# Patient Record
Sex: Female | Born: 1995 | Race: White | Hispanic: No | Marital: Single | Smoking: Never smoker
Health system: Southern US, Community
[De-identification: ages and names within clinical notes are randomized; demographics above are authoritative.]

## PROBLEM LIST (undated history)

## (undated) DIAGNOSIS — F419 Anxiety disorder, unspecified: Secondary | ICD-10-CM

## (undated) DIAGNOSIS — K219 Gastro-esophageal reflux disease without esophagitis: Secondary | ICD-10-CM

---
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
## 2014-03-08 DIAGNOSIS — F79 Unspecified intellectual disabilities: Secondary | ICD-10-CM | POA: Insufficient documentation

## 2014-03-10 ENCOUNTER — Emergency Department: Payer: Self-pay | Admitting: Emergency Medicine

## 2014-09-16 ENCOUNTER — Other Ambulatory Visit: Payer: Self-pay

## 2014-09-16 ENCOUNTER — Emergency Department: Payer: Medicaid Other

## 2014-09-16 ENCOUNTER — Emergency Department
Admission: EM | Admit: 2014-09-16 | Discharge: 2014-09-16 | Disposition: A | Discharge status: Home / Self Care | Payer: Medicaid Other | Attending: Emergency Medicine | Admitting: Emergency Medicine

## 2014-09-16 ENCOUNTER — Encounter: Payer: Self-pay | Admitting: Emergency Medicine

## 2014-09-16 DIAGNOSIS — R0789 Other chest pain: Secondary | ICD-10-CM | POA: Diagnosis not present

## 2014-09-16 DIAGNOSIS — K219 Gastro-esophageal reflux disease without esophagitis: Secondary | ICD-10-CM | POA: Diagnosis not present

## 2014-09-16 DIAGNOSIS — R079 Chest pain, unspecified: Secondary | ICD-10-CM | POA: Diagnosis present

## 2014-09-16 HISTORY — DX: Gastro-esophageal reflux disease without esophagitis: K21.9

## 2014-09-16 HISTORY — DX: Anxiety disorder, unspecified: F41.9

## 2014-09-16 LAB — CBC
HEMATOCRIT: 38 % (ref 35.0–47.0)
HEMOGLOBIN: 12.7 g/dL (ref 12.0–16.0)
MCH: 28.7 pg (ref 26.0–34.0)
MCHC: 33.4 g/dL (ref 32.0–36.0)
MCV: 86 fL (ref 80.0–100.0)
Platelets: 205 10*3/uL (ref 150–440)
RBC: 4.42 MIL/uL (ref 3.80–5.20)
RDW: 11.7 % (ref 11.5–14.5)
WBC: 6.7 10*3/uL (ref 3.6–11.0)

## 2014-09-16 LAB — BASIC METABOLIC PANEL
Anion gap: 6 (ref 5–15)
BUN: 15 mg/dL (ref 6–20)
CALCIUM: 9.5 mg/dL (ref 8.9–10.3)
CHLORIDE: 107 mmol/L (ref 101–111)
CO2: 28 mmol/L (ref 22–32)
CREATININE: 0.73 mg/dL (ref 0.44–1.00)
GFR calc Af Amer: >60 mL/min (ref >60)
GFR calc non Af Amer: >60 mL/min (ref >60)
Glucose, Bld: 89 mg/dL (ref 65–99)
Potassium: 3.7 mmol/L (ref 3.5–5.1)
Sodium: 141 mmol/L (ref 135–145)

## 2014-09-16 LAB — TROPONIN I: Troponin I: <0.03 ng/mL (ref <0.031)

## 2014-09-16 MED ORDER — FAMOTIDINE 40 MG PO TABS: 40.0000 mg | ORAL_TABLET | Freq: Every evening | ORAL | Status: DC

## 2014-09-16 NOTE — ED Notes (Signed)
Patient denies pain and is resting comfortably. Pt denies pain at this time, but states her pain was at  3/10  in the waiting room.

## 2014-09-16 NOTE — ED Provider Notes (Signed)
[facility] Emergency Department Provider Note    ____________________________________________  Time seen: 10:50 PM  I have reviewed the triage vital signs and the nursing notes.   HISTORY  Chief Complaint Arm Pain and Chest Pain       HPI Becky Park is a 19 y.o. female without any medical problems presents with 1 month of chest burning. She became concerned this week when she had tingling in her right and left arms. She says that her chest burning which is mild to moderate starts after eating. She says she takes Tums which relieves the pain. However, she says that the next time she eats the pain comes back. She is not having any shortness of breath nausea or vomiting. She is concerned because her father died of a heart attack at 28 years old. The pain is not brought on by exertion.At the time of this exam the patient denies any chest pain. The pain lasts from after she eats and this takes the Tums. Patient says she went to another medical facility about a month ago and was given an antacid. She does not know the name of the antacid but says that she felt like she had palpitations after taking it and stopped taking it. Patient does not take birth control pills. Says unlikely that she is pregnant. Furthermore she denied any history of sudden cardiac death in her family at a young age.     Past Medical History  Diagnosis Date  . Anxiety   . GERD (gastroesophageal reflux disease)     There are no active problems to display for this patient.   History reviewed. No pertinent past surgical history.  No current outpatient prescriptions on file.  Allergies Review of patient's allergies indicates no known allergies.  No family history on file.  Social History History  Substance Use Topics  . Smoking status: Never Smoker   . Smokeless tobacco: Not on file  . Alcohol Use: No    Review of Systems  Constitutional: Negative for fever. Eyes:  Negative for visual changes. ENT: Negative for sore throat. Cardiovascular: Chest pain.  Respiratory: Negative for shortness of breath. Gastrointestinal: Negative for abdominal pain, vomiting and diarrhea. Genitourinary: Negative for dysuria. Musculoskeletal: Negative for back pain. Skin: Negative for rash. Neurological: Negative for headaches, focal weakness or numbness.   10-point ROS otherwise negative.  ____________________________________________   PHYSICAL EXAM:  VITAL SIGNS: ED Triage Vitals  Enc Vitals Group     BP 09/16/14 1908 130/70 mmHg     Pulse Rate 09/16/14 1908 69     Resp 09/16/14 1908 16     Temp 09/16/14 1908 98 F (36.7 C)     Temp Source 09/16/14 1908 Oral     SpO2 09/16/14 1908 100 %     Weight 09/16/14 1908 120 lb (54.432 kg)     Height 09/16/14 1908  (1.702 m)     Head Cir --      Peak Flow --      Pain Score 09/16/14 1908 4     Pain Loc --      Pain Edu? --      Excl. in GC? --      Constitutional: Alert and oriented. Well appearing and in no distress. Eyes: Conjunctivae are normal. PERRL. Normal extraocular movements. ENT   Head: Normocephalic and atraumatic.   Nose: No congestion/rhinnorhea.   Mouth/Throat: Mucous membranes are moist.   Neck: No stridor. Hematological/Lymphatic/Immunilogical: No cervical lymphadenopathy. Cardiovascular: Normal rate,  regular rhythm. Normal and symmetric distal pulses are present in all extremities. No murmurs, rubs, or gallops. Respiratory: Normal respiratory effort without tachypnea nor retractions. Breath sounds are clear and equal bilaterally. No wheezes/rales/rhonchi. Gastrointestinal: Soft and nontender. No distention. No abdominal bruits. There is no CVA tenderness. Genitourinary: Deferred Musculoskeletal: Nontender with normal range of motion in all extremities. No joint effusions.  No lower extremity tenderness nor edema. Neurologic:  Normal speech and language. No gross focal  neurologic deficits are appreciated. Speech is normal. No gait instability. Skin:  Skin is warm, dry and intact. No rash noted. Psychiatric: Mood and affect are normal. Speech and behavior are normal. Patient exhibits appropriate insight and judgment.  ____________________________________________   EKG   Date: 09/16/2014  Rate: 74  Rhythm: normal sinus rhythm  QRS Axis: normal  Intervals: normal  ST/T Wave abnormalities: normal  Conduction Disutrbances: none  Narrative Interpretation: Sinus rhythm with marked sinus arrhythmia      ____________________________________________    RADIOLOGY  Chest x-ray nad.    ____________________________________________     ____________________________________________   INITIAL IMPRESSION / ASSESSMENT AND PLAN / ED COURSE  Pertinent labs & imaging results that were available during my care of the patient were reviewed by me and considered in my medical decision making (see chart for details).  Impression is that this otherwise healthy young woman is having episodes of reflux. She states that her mother has a history of reflux. At 19 years old she is highly unlikely to have coronary artery disease. I counseled her about this and that it will be important to continue to follow-up with her primary care doctor as her primary care doctor sees fit. We discussed further screening as she gets older such as for diabetes and high cholesterol. [place]  ____________________________________________   FINAL CLINICAL IMPRESSION(S) / ED DIAGNOSES  Chest pain, GERD. Acute, initial visit   Arelia Longestavid M Maddox Hlavaty, MD 09/16/14 2307

## 2014-09-16 NOTE — ED Notes (Signed)
Patient c/o right arm soreness and tingling x1 week. Also experiencing intermittent chest pain. No obvious distress or shrotness of breath at this time.

## 2014-10-09 ENCOUNTER — Emergency Department
Admission: EM | Admit: 2014-10-09 | Discharge: 2014-10-09 | Disposition: A | Discharge status: Home / Self Care | Payer: Medicaid Other | Attending: Emergency Medicine | Admitting: Emergency Medicine

## 2014-10-09 ENCOUNTER — Emergency Department: Payer: Medicaid Other

## 2014-10-09 ENCOUNTER — Encounter: Payer: Self-pay | Admitting: Urgent Care

## 2014-10-09 DIAGNOSIS — K219 Gastro-esophageal reflux disease without esophagitis: Secondary | ICD-10-CM | POA: Diagnosis not present

## 2014-10-09 DIAGNOSIS — R1011 Right upper quadrant pain: Secondary | ICD-10-CM | POA: Diagnosis not present

## 2014-10-09 DIAGNOSIS — Z3202 Encounter for pregnancy test, result negative: Secondary | ICD-10-CM | POA: Insufficient documentation

## 2014-10-09 DIAGNOSIS — Z79899 Other long term (current) drug therapy: Secondary | ICD-10-CM | POA: Insufficient documentation

## 2014-10-09 LAB — COMPREHENSIVE METABOLIC PANEL
ALT: 14 U/L (ref 14–54)
AST: 18 U/L (ref 15–41)
Albumin: 4.5 g/dL (ref 3.5–5.0)
Alkaline Phosphatase: 50 U/L (ref 38–126)
Anion gap: 6 (ref 5–15)
BUN: 15 mg/dL (ref 6–20)
CHLORIDE: 105 mmol/L (ref 101–111)
CO2: 27 mmol/L (ref 22–32)
Calcium: 9.4 mg/dL (ref 8.9–10.3)
Creatinine, Ser: 0.59 mg/dL (ref 0.44–1.00)
Glucose, Bld: 94 mg/dL (ref 65–99)
Potassium: 3.4 mmol/L — ABNORMAL LOW (ref 3.5–5.1)
Sodium: 138 mmol/L (ref 135–145)
Total Bilirubin: 0.7 mg/dL (ref 0.3–1.2)
Total Protein: 7.5 g/dL (ref 6.5–8.1)

## 2014-10-09 LAB — URINALYSIS COMPLETE WITH MICROSCOPIC (ARMC ONLY)
Bacteria, UA: NONE SEEN
Bilirubin Urine: NEGATIVE
GLUCOSE, UA: NEGATIVE mg/dL
HGB URINE DIPSTICK: NEGATIVE
Ketones, ur: NEGATIVE mg/dL
LEUKOCYTES UA: NEGATIVE
NITRITE: NEGATIVE
Protein, ur: 30 mg/dL — AB
RBC / HPF: NONE SEEN RBC/hpf (ref 0–5)
Specific Gravity, Urine: 1.028 (ref 1.005–1.030)
pH: 8 (ref 5.0–8.0)

## 2014-10-09 LAB — CBC WITH DIFFERENTIAL/PLATELET
Basophils Absolute: 0 10*3/uL (ref 0–0.1)
Basophils Relative: 0 %
EOS ABS: 0.1 10*3/uL (ref 0–0.7)
Eosinophils Relative: 1 %
HCT: 34 % — ABNORMAL LOW (ref 35.0–47.0)
HEMOGLOBIN: 11.5 g/dL — AB (ref 12.0–16.0)
Lymphocytes Relative: 31 %
Lymphs Abs: 2.7 10*3/uL (ref 1.0–3.6)
MCH: 29.1 pg (ref 26.0–34.0)
MCHC: 33.9 g/dL (ref 32.0–36.0)
MCV: 85.9 fL (ref 80.0–100.0)
MONO ABS: 0.4 10*3/uL (ref 0.2–0.9)
Monocytes Relative: 4 %
NEUTROS ABS: 5.6 10*3/uL (ref 1.4–6.5)
NEUTROS PCT: 64 %
PLATELETS: 196 10*3/uL (ref 150–440)
RBC: 3.95 MIL/uL (ref 3.80–5.20)
RDW: 12.2 % (ref 11.5–14.5)
WBC: 8.9 10*3/uL (ref 3.6–11.0)

## 2014-10-09 LAB — LIPASE, BLOOD: LIPASE: 38 U/L (ref 22–51)

## 2014-10-09 LAB — POCT PREGNANCY, URINE: PREG TEST UR: NEGATIVE

## 2014-10-09 NOTE — ED Notes (Addendum)
Patient presents with c/o epigastric pain with (+) radiation into RUQ for over a month. Pain is through and through to her back. Pain is worse after eating. Denies N/V/D/F. No known gallbladder issues. Patient was seen here on 5/1 for reflux and Rx'd Pepcid 40mg  PO daily - has been taking and reports that her reflux has improved, however this is different.

## 2014-10-09 NOTE — Discharge Instructions (Signed)
Abdominal Pain, Women °Abdominal (stomach, pelvic, or belly) pain can be caused by many things. It is important to tell your doctor: °· The location of the pain. °· Does it come and go or is it present all the time? °· Are there things that start the pain (eating certain foods, exercise)? °· Are there other symptoms associated with the pain (fever, nausea, vomiting, diarrhea)? °All of this is helpful to know when trying to find the cause of the pain. °CAUSES  °· Stomach: virus or bacteria infection, or ulcer. °· Intestine: appendicitis (inflamed appendix), regional ileitis (Crohn's disease), ulcerative colitis (inflamed colon), irritable bowel syndrome, diverticulitis (inflamed diverticulum of the colon), or cancer of the stomach or intestine. °· Gallbladder disease or stones in the gallbladder. °· Kidney disease, kidney stones, or infection. °· Pancreas infection or cancer. °· Fibromyalgia (pain disorder). °· Diseases of the female organs: °¨ Uterus: fibroid (non-cancerous) tumors or infection. °¨ Fallopian tubes: infection or tubal pregnancy. °¨ Ovary: cysts or tumors. °¨ Pelvic adhesions (scar tissue). °¨ Endometriosis (uterus lining tissue growing in the pelvis and on the pelvic organs). °¨ Pelvic congestion syndrome (female organs filling up with blood just before the menstrual period). °¨ Pain with the menstrual period. °¨ Pain with ovulation (producing an egg). °¨ Pain with an IUD (intrauterine device, birth control) in the uterus. °¨ Cancer of the female organs. °· Functional pain (pain not caused by a disease, may improve without treatment). °· Psychological pain. °· Depression. °DIAGNOSIS  °Your doctor will decide the seriousness of your pain by doing an examination. °· Blood tests. °· X-rays. °· Ultrasound. °· CT scan (computed tomography, special type of X-ray). °· MRI (magnetic resonance imaging). °· Cultures, for infection. °· Barium enema (dye inserted in the large intestine, to better view it with  X-rays). °· Colonoscopy (looking in intestine with a lighted tube). °· Laparoscopy (minor surgery, looking in abdomen with a lighted tube). °· Major abdominal exploratory surgery (looking in abdomen with a large incision). °TREATMENT  °The treatment will depend on the cause of the pain.  °· Many cases can be observed and treated at home. °· Over-the-counter medicines recommended by your caregiver. °· Prescription medicine. °· Antibiotics, for infection. °· Birth control pills, for painful periods or for ovulation pain. °· Hormone treatment, for endometriosis. °· Nerve blocking injections. °· Physical therapy. °· Antidepressants. °· Counseling with a psychologist or psychiatrist. °· Minor or major surgery. °HOME CARE INSTRUCTIONS  °· Do not take laxatives, unless directed by your caregiver. °· Take over-the-counter pain medicine only if ordered by your caregiver. Do not take aspirin because it can cause an upset stomach or bleeding. °· Try a clear liquid diet (broth or water) as ordered by your caregiver. Slowly move to a bland diet, as tolerated, if the pain is related to the stomach or intestine. °· Have a thermometer and take your temperature several times a day, and record it. °· Bed rest and sleep, if it helps the pain. °· Avoid sexual intercourse, if it causes pain. °· Avoid stressful situations. °· Keep your follow-up appointments and tests, as your caregiver orders. °· If the pain does not go away with medicine or surgery, you may try: °¨ Acupuncture. °¨ Relaxation exercises (yoga, meditation). °¨ Group therapy. °¨ Counseling. °SEEK MEDICAL CARE IF:  °· You notice certain foods cause stomach pain. °· Your home care treatment is not helping your pain. °· You need stronger pain medicine. °· You want your IUD removed. °· You feel faint or   lightheaded. °· You develop nausea and vomiting. °· You develop a rash. °· You are having side effects or an allergy to your medicine. °SEEK IMMEDIATE MEDICAL CARE IF:  °· Your  pain does not go away or gets worse. °· You have a fever. °· Your pain is felt only in portions of the abdomen. The right side could possibly be appendicitis. The left lower portion of the abdomen could be colitis or diverticulitis. °· You are passing blood in your stools (bright red or black tarry stools, with or without vomiting). °· You have blood in your urine. °· You develop chills, with or without a fever. °· You pass out. °MAKE SURE YOU:  °· Understand these instructions. °· Will watch your condition. °· Will get help right away if you are not doing well or get worse. °Document Released: 03/01/2007 Document Revised: 09/18/2013 Document Reviewed: 03/21/2009 °[employer]® Patient Information ©2015 [employer]. This information is not intended to replace advice given to you by your health care provider. Make sure you discuss any questions you have with your health care provider. ° °

## 2014-10-09 NOTE — ED Provider Notes (Signed)
[facility] Emergency Department Provider Note  Time seen: 9:33 PM  I have reviewed the triage vital signs and the nursing notes.   HISTORY  Chief Complaint Abdominal Pain    HPI Becky Park is a 19 y.o. female with a past medical history of gastric reflux presents to the emergency department right upper quadrant abdominal pain 1 month. According to the patient for the past one month she has had intermittent right upper quadrant abdominal pain which she describes as moderate in severity, no radiation, worse after she eats. She has been taking her reflux medication but states this has not helped. Denies any chest pain, shortness of breath, dysuria, nausea/vomiting, diarrhea.     Past Medical History  Diagnosis Date  . Anxiety   . GERD (gastroesophageal reflux disease)     There are no active problems to display for this patient.   History reviewed. No pertinent past surgical history.  Current Outpatient Rx  Name  Route  Sig  Dispense  Refill  . famotidine (PEPCID) 40 MG tablet   Oral   Take 1 tablet (40 mg total) by mouth every evening.   30 tablet   1     Allergies Review of patient's allergies indicates no known allergies.  No family history on file.  Social History History  Substance Use Topics  . Smoking status: Never Smoker   . Smokeless tobacco: Not on file  . Alcohol Use: No    Review of Systems Constitutional: Negative for fever. Cardiovascular: Negative for chest pain. Respiratory: Negative for shortness of breath. Gastrointestinal: Positive for right upper quadrant abdominal pain. Negative for nausea/vomiting/diarrhea. Genitourinary: Negative for dysuria. Musculoskeletal: Negative for back pain.  10-point ROS otherwise negative.  ____________________________________________   PHYSICAL EXAM:  VITAL SIGNS: ED Triage Vitals  Enc Vitals Group     BP 10/09/14 1953 119/70 mmHg     Pulse Rate 10/09/14 1953 85   Resp 10/09/14 1953 16     Temp 10/09/14 1953 98.2 F (36.8 C)     Temp Source 10/09/14 1953 Oral     SpO2 10/09/14 1953 99 %     Weight 10/09/14 1953 110 lb (49.896 kg)     Height 10/09/14 1953  (1.702 m)     Head Cir --      Peak Flow --      Pain Score 10/09/14 1954 0     Pain Loc --      Pain Edu? --      Excl. in GC? --     Constitutional: Alert and oriented. Well appearing and in no distress. ENT   Mouth/Throat: Mucous membranes are moist. Cardiovascular: Normal rate, regular rhythm. No murmurs Respiratory: Normal respiratory effort without tachypnea nor retractions. Breath sounds are clear  Gastrointestinal: Soft, nontender. No current right upper quadrant tenderness palpation or epigastric tenderness. Benign abdominal exam. Musculoskeletal: Nontender with normal range of motion in all extremities Neurologic:  Normal speech and language. No gross focal neurologic deficits Skin:  Skin is warm, dry and intact.  Psychiatric: Mood and affect are normal. Speech and behavior are normal.  ____________________________________________    RADIOLOGY  Ultrasound within normal limits, no gallstones noted.  ____________________________________________   INITIAL IMPRESSION / ASSESSMENT AND PLAN / ED COURSE  Pertinent labs & imaging results that were available during my care of the patient were reviewed by me and considered in my medical decision making (see chart for details).  19 year old female with a past medical history  of gastric reflux presents the emergency department with 1 month of intermittent right upper quadrant abdominal pain, worse with eating. We will check labs, and proceed with right upper quadrant ultrasound to further evaluate. Patient currently has a benign abdominal exam, nontender.  ----------------------------------------- 10:20 PM on 10/09/2014 -----------------------------------------  Ultrasound within normal limits, no gallstones noted. Patient  continues to be nontender in the emergency department. We will discharge home with primary care follow-up.   patient agreeable plan. ____________________________________________   FINAL CLINICAL IMPRESSION(S) / ED DIAGNOSES  Right upper quadrant abdominal pain   Minna AntisKevin Suhana Wilner, MD 10/09/14 2221

## 2015-01-23 ENCOUNTER — Emergency Department: Payer: Medicaid Other

## 2015-01-23 ENCOUNTER — Emergency Department
Admission: EM | Admit: 2015-01-23 | Discharge: 2015-01-23 | Disposition: A | Discharge status: Home / Self Care | Payer: Medicaid Other | Attending: Emergency Medicine | Admitting: Emergency Medicine

## 2015-01-23 DIAGNOSIS — K219 Gastro-esophageal reflux disease without esophagitis: Secondary | ICD-10-CM | POA: Diagnosis not present

## 2015-01-23 DIAGNOSIS — R079 Chest pain, unspecified: Secondary | ICD-10-CM | POA: Diagnosis present

## 2015-01-23 LAB — CBC
HCT: 35.4 % (ref 35.0–47.0)
Hemoglobin: 12 g/dL (ref 12.0–16.0)
MCH: 29.4 pg (ref 26.0–34.0)
MCHC: 33.8 g/dL (ref 32.0–36.0)
MCV: 87.1 fL (ref 80.0–100.0)
Platelets: 171 10*3/uL (ref 150–440)
RBC: 4.07 MIL/uL (ref 3.80–5.20)
RDW: 12.5 % (ref 11.5–14.5)
WBC: 7.6 10*3/uL (ref 3.6–11.0)

## 2015-01-23 LAB — COMPREHENSIVE METABOLIC PANEL
ALT: 16 U/L (ref 14–54)
AST: 26 U/L (ref 15–41)
Albumin: 4.6 g/dL (ref 3.5–5.0)
Alkaline Phosphatase: 45 U/L (ref 38–126)
Anion gap: 7 (ref 5–15)
BUN: 13 mg/dL (ref 6–20)
CHLORIDE: 105 mmol/L (ref 101–111)
CO2: 26 mmol/L (ref 22–32)
CREATININE: 0.66 mg/dL (ref 0.44–1.00)
Calcium: 9.8 mg/dL (ref 8.9–10.3)
GFR calc Af Amer: >60 mL/min (ref >60)
GFR calc non Af Amer: >60 mL/min (ref >60)
Glucose, Bld: 113 mg/dL — ABNORMAL HIGH (ref 65–99)
Potassium: 3.3 mmol/L — ABNORMAL LOW (ref 3.5–5.1)
SODIUM: 138 mmol/L (ref 135–145)
Total Bilirubin: 0.8 mg/dL (ref 0.3–1.2)
Total Protein: 7.5 g/dL (ref 6.5–8.1)

## 2015-01-23 LAB — TROPONIN I: Troponin I: <0.03 ng/mL (ref <0.031)

## 2015-01-23 MED ORDER — RANITIDINE HCL 150 MG PO TABS
150.0000 mg | ORAL_TABLET | Freq: Every day | ORAL | Status: DC
Start: 2015-01-23 — End: 2018-12-20

## 2015-01-23 MED ORDER — GI COCKTAIL ~~LOC~~
30.0000 mL | Freq: Once | ORAL | Status: AC
  Administered 2015-01-23: 30 mL via ORAL

## 2015-01-23 MED ORDER — GI COCKTAIL ~~LOC~~
ORAL | Status: AC
  Filled 2015-01-23: qty 30

## 2015-01-23 NOTE — ED Provider Notes (Signed)
[facility] Emergency Department Provider Note     Time seen: ----------------------------------------- 8:49 PM on 01/23/2015 -----------------------------------------    I have reviewed the triage vital signs and the nursing notes.   HISTORY  Chief Complaint Chest Pain    HPI Becky Park is a 19 y.o. female who presents ER for chest pain since today. No recent illness, does complain of pain in left chest left arm and back. Patient states Tums helped his symptoms earlier. She does have a history of GERD and she takes Pepcid.   Past Medical History  Diagnosis Date  . Anxiety   . GERD (gastroesophageal reflux disease)     There are no active problems to display for this patient.   No past surgical history on file.  Allergies Review of patient's allergies indicates no known allergies.  Social History Social History  Substance Use Topics  . Smoking status: Never Smoker   . Smokeless tobacco: Not on file  . Alcohol Use: No    Review of Systems Constitutional: Negative for fever. Eyes: Negative for visual changes. ENT: Negative for sore throat. Cardiovascular: Positive for chest pain Respiratory: Negative for shortness of breath. Gastrointestinal: Negative for abdominal pain, vomiting and diarrhea. Genitourinary: Negative for dysuria. Musculoskeletal: Negative for back pain. Skin: Negative for rash. Neurological: Negative for headaches, focal weakness or numbness.  10-point ROS otherwise negative.  ____________________________________________   PHYSICAL EXAM:  VITAL SIGNS: ED Triage Vitals  Enc Vitals Group     BP 01/23/15 1951 134/70 mmHg     Pulse Rate 01/23/15 1951 69     Resp 01/23/15 1951 18     Temp 01/23/15 1951 98.5 F (36.9 C)     Temp Source 01/23/15 1951 Oral     SpO2 01/23/15 1951 100 %     Weight 01/23/15 1951 100 lb (45.36 kg)     Height 01/23/15 1951  (1.702 m)     Head Cir --      Peak Flow --     Pain Score 01/23/15 1952 1     Pain Loc --      Pain Edu? --      Excl. in GC? --     Constitutional: Alert and oriented. Well appearing and in no distress. Eyes: Conjunctivae are normal. PERRL. Normal extraocular movements. ENT   Head: Normocephalic and atraumatic.   Nose: No congestion/rhinnorhea.   Mouth/Throat: Mucous membranes are moist.   Neck: No stridor. Cardiovascular: Normal rate, regular rhythm. Normal and symmetric distal pulses are present in all extremities. No murmurs, rubs, or gallops. Respiratory: Normal respiratory effort without tachypnea nor retractions. Breath sounds are clear and equal bilaterally. No wheezes/rales/rhonchi. Gastrointestinal: Soft and nontender. No distention. No abdominal bruits.  Musculoskeletal: Nontender with normal range of motion in all extremities. No joint effusions.  No lower extremity tenderness nor edema. Neurologic:  Normal speech and language. No gross focal neurologic deficits are appreciated. Speech is normal. No gait instability. Skin:  Skin is warm, dry and intact. No rash noted. Psychiatric: Mood and affect are normal. Speech and behavior are normal. Patient exhibits appropriate insight and judgment. ____________________________________________  EKG: Interpreted by me. Sinus rhythm with marked sinus arrhythmia, rate 67 bpm, normal PR interval, normal QS with, normal QT interval.  ____________________________________________  ED COURSE:  Pertinent labs & imaging results that were available during my care of the patient were reviewed by me and considered in my medical decision making (see chart for details). Patient is in no  acute distress, will check basic labs and reevaluate. ____________________________________________    LABS (pertinent positives/negatives)  Labs Reviewed  COMPREHENSIVE METABOLIC PANEL - Abnormal; Notable for the following:    Potassium 3.3 (*)    Glucose, Bld 113 (*)    All other components  within normal limits  TROPONIN I  CBC    RADIOLOGY Images were viewed by me  Chest x-ray  IMPRESSION: No active cardiopulmonary disease. ____________________________________________  FINAL ASSESSMENT AND PLAN  Chest pain  Plan: Patient with labs and imaging as dictated above. Patient is in no acute distress, either GERD or anxiety related. She stable for outpatient follow-up with her doctor   [name], MD   [name], MD 01/23/15 2116

## 2015-01-23 NOTE — ED Notes (Signed)
Pt in with co chest pain since today, no recent illness. Pain to left chest, left arm, and back.

## 2015-01-23 NOTE — ED Notes (Signed)
Patient presents to ED with c/o left sided chest pain radiating to back. Patient reports to taking TUMS with relief, has history of similar symptoms. Patient denies shortness of breath, abdominal pain, dizziness, or blurry vision. Patient alert and oriented x 4, respirations even and unlabored.

## 2015-01-23 NOTE — Discharge Instructions (Signed)
Chest Pain (Nonspecific) °It is often hard to give a specific diagnosis for the cause of chest pain. There is always a chance that your pain could be related to something serious, such as a heart attack or a blood clot in the lungs. You need to follow up with your health care provider for further evaluation. °CAUSES  °· Heartburn. °· Pneumonia or bronchitis. °· Anxiety or stress. °· Inflammation around your heart (pericarditis) or lung (pleuritis or pleurisy). °· A blood clot in the lung. °· A collapsed lung (pneumothorax). It can develop suddenly on its own (spontaneous pneumothorax) or from trauma to the chest. °· Shingles infection (herpes zoster virus). °The chest wall is composed of bones, muscles, and cartilage. Any of these can be the source of the pain. °· The bones can be bruised by injury. °· The muscles or cartilage can be strained by coughing or overwork. °· The cartilage can be affected by inflammation and become sore (costochondritis). °DIAGNOSIS  °Lab tests or other studies may be needed to find the cause of your pain. Your health care provider may have you take a test called an ambulatory electrocardiogram (ECG). An ECG records your heartbeat patterns over a 24-hour period. You may also have other tests, such as: °· Transthoracic echocardiogram (TTE). During echocardiography, sound waves are used to evaluate how blood flows through your heart. °· Transesophageal echocardiogram (TEE). °· Cardiac monitoring. This allows your health care provider to monitor your heart rate and rhythm in real time. °· Holter monitor. This is a portable device that records your heartbeat and can help diagnose heart arrhythmias. It allows your health care provider to track your heart activity for several days, if needed. °· Stress tests by exercise or by giving medicine that makes the heart beat faster. °TREATMENT  °· Treatment depends on what may be causing your chest pain. Treatment may include: °· Acid blockers for  heartburn. °· Anti-inflammatory medicine. °· Pain medicine for inflammatory conditions. °· Antibiotics if an infection is present. °· You may be advised to change lifestyle habits. This includes stopping smoking and avoiding alcohol, caffeine, and chocolate. °· You may be advised to keep your head raised (elevated) when sleeping. This reduces the chance of acid going backward from your stomach into your esophagus. °Most of the time, nonspecific chest pain will improve within 2-3 days with rest and mild pain medicine.  °HOME CARE INSTRUCTIONS  °· If antibiotics were prescribed, take them as directed. Finish them even if you start to feel better. °· For the next few days, avoid physical activities that bring on chest pain. Continue physical activities as directed. °· Do not use any tobacco products, including cigarettes, chewing tobacco, or electronic cigarettes. °· Avoid drinking alcohol. °· Only take medicine as directed by your health care provider. °· Follow your health care provider's suggestions for further testing if your chest pain does not go away. °· Keep any follow-up appointments you made. If you do not go to an appointment, you could develop lasting (chronic) problems with pain. If there is any problem keeping an appointment, call to reschedule. °SEEK MEDICAL CARE IF:  °· Your chest pain does not go away, even after treatment. °· You have a rash with blisters on your chest. °· You have a fever. °SEEK IMMEDIATE MEDICAL CARE IF:  °· You have increased chest pain or pain that spreads to your arm, neck, jaw, back, or abdomen. °· You have shortness of breath. °· You have an increasing cough, or you cough   up blood. °· You have severe back or abdominal pain. °· You feel nauseous or vomit. °· You have severe weakness. °· You faint. °· You have chills. °This is an emergency. Do not wait to see if the pain will go away. Get medical help at once. Call your local emergency services (911 in [place] Do not drive  yourself to the hospital. °MAKE SURE YOU:  °· Understand these instructions. °· Will watch your condition. °· Will get help right away if you are not doing well or get worse. °Document Released: 02/11/2005 Document Revised: 05/09/2013 Document Reviewed: 12/08/2007 °ExitCare® Patient Information ©2015 [employer]. This information is not intended to replace advice given to you by your health care provider. Make sure you discuss any questions you have with your health care provider. °Gastroesophageal Reflux Disease, Adult °Gastroesophageal reflux disease (GERD) happens when acid from your stomach flows up into the esophagus. When acid comes in contact with the esophagus, the acid causes soreness (inflammation) in the esophagus. Over time, GERD may create small holes (ulcers) in the lining of the esophagus. °CAUSES  °· Increased body weight. This puts pressure on the stomach, making acid rise from the stomach into the esophagus. °· Smoking. This increases acid production in the stomach. °· Drinking alcohol. This causes decreased pressure in the lower esophageal sphincter (valve or ring of muscle between the esophagus and stomach), allowing acid from the stomach into the esophagus. °· Late evening meals and a full stomach. This increases pressure and acid production in the stomach. °· A malformed lower esophageal sphincter. °Sometimes, no cause is found. °SYMPTOMS  °· Burning pain in the lower part of the mid-chest behind the breastbone and in the mid-stomach area. This may occur twice a week or more often. °· Trouble swallowing. °· Sore throat. °· Dry cough. °· Asthma-like symptoms including chest tightness, shortness of breath, or wheezing. °DIAGNOSIS  °Your caregiver may be able to diagnose GERD based on your symptoms. In some cases, X-rays and other tests may be done to check for complications or to check the condition of your stomach and esophagus. °TREATMENT  °Your caregiver may recommend over-the-counter or  prescription medicines to help decrease acid production. Ask your caregiver before starting or adding any new medicines.  °HOME CARE INSTRUCTIONS  °· Change the factors that you can control. Ask your caregiver for guidance concerning weight loss, quitting smoking, and alcohol consumption. °· Avoid foods and drinks that make your symptoms worse, such as: °¨ Caffeine or alcoholic drinks. °¨ Chocolate. °¨ Peppermint or mint flavorings. °¨ Garlic and onions. °¨ Spicy foods. °¨ Citrus fruits, such as oranges, lemons, or limes. °¨ Tomato-based foods such as sauce, chili, salsa, and pizza. °¨ Fried and fatty foods. °· Avoid lying down for the 3 hours prior to your bedtime or prior to taking a nap. °· Eat small, frequent meals instead of large meals. °· Wear loose-fitting clothing. Do not wear anything tight around your waist that causes pressure on your stomach. °· Raise the head of your bed 6 to 8 inches with wood blocks to help you sleep. Extra pillows will not help. °· Only take over-the-counter or prescription medicines for pain, discomfort, or fever as directed by your caregiver. °· Do not take aspirin, ibuprofen, or other nonsteroidal anti-inflammatory drugs (NSAIDs). °SEEK IMMEDIATE MEDICAL CARE IF:  °· You have pain in your arms, neck, jaw, teeth, or back. °· Your pain increases or changes in intensity or duration. °· You develop nausea, vomiting, or sweating (diaphoresis). °·   You develop shortness of breath, or you faint. °· Your vomit is green, yellow, black, or looks like coffee grounds or blood. °· Your stool is red, bloody, or black. °These symptoms could be signs of other problems, such as heart disease, gastric bleeding, or esophageal bleeding. °MAKE SURE YOU:  °· Understand these instructions. °· Will watch your condition. °· Will get help right away if you are not doing well or get worse. °Document Released: 02/11/2005 Document Revised: 07/27/2011 Document Reviewed: 11/21/2010 °ExitCare® Patient  Information ©2015 [employer]. This information is not intended to replace advice given to you by your health care provider. Make sure you discuss any questions you have with your health care provider. ° °

## 2015-01-23 NOTE — ED Notes (Signed)

## 2015-04-22 ENCOUNTER — Emergency Department: Payer: Medicaid Other

## 2015-04-22 ENCOUNTER — Emergency Department
Admission: EM | Admit: 2015-04-22 | Discharge: 2015-04-22 | Disposition: A | Discharge status: Home / Self Care | Payer: Medicaid Other | Attending: Emergency Medicine | Admitting: Emergency Medicine

## 2015-04-22 DIAGNOSIS — M25572 Pain in left ankle and joints of left foot: Secondary | ICD-10-CM

## 2015-04-22 DIAGNOSIS — F419 Anxiety disorder, unspecified: Secondary | ICD-10-CM | POA: Insufficient documentation

## 2015-04-22 DIAGNOSIS — Z79899 Other long term (current) drug therapy: Secondary | ICD-10-CM | POA: Insufficient documentation

## 2015-04-22 NOTE — Discharge Instructions (Signed)
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 Advised to wear elastic ankle support from any strenuous activity. Ankle Pain Ankle pain is a common symptom. The bones, cartilage, tendons, and muscles of the ankle joint perform a lot of work each day. The ankle joint holds your body weight and allows you to move around. Ankle pain can occur on either side or back of 1 or both ankles. Ankle pain may be sharp and burning or dull and aching. There may be tenderness, stiffness, redness, or warmth around the ankle. The pain occurs more often when a person walks or puts pressure on the ankle. CAUSES  There are many reasons ankle pain can develop. It is important to work with your caregiver to identify the cause since many conditions can impact the bones, cartilage, muscles, and tendons. Causes for ankle pain include:  Injury, including a break (fracture), sprain, or strain often due to a fall, sports, or a high-impact activity.  Swelling (inflammation) of a tendon (tendonitis).  Achilles tendon rupture.  Ankle instability after repeated sprains and strains.  Poor foot alignment.  Pressure on a nerve (tarsal tunnel syndrome).  Arthritis in the ankle or the lining of the ankle.  Crystal formation in the ankle (gout or pseudogout). DIAGNOSIS  A diagnosis is based on your medical history, your symptoms, results of your physical exam, and results of diagnostic tests. Diagnostic tests may include X-ray exams or a computerized magnetic scan (magnetic resonance imaging, MRI). TREATMENT  Treatment will depend on the cause of your ankle pain and may include:  Keeping pressure off the ankle and limiting activities.  Using crutches or other walking support (a cane or brace).  Using rest, ice, compression, and elevation.  Participating in physical therapy or home exercises.  Wearing shoe inserts or special shoes.  Losing weight.  Taking medications to reduce pain or swelling or receiving an  injection.  Undergoing surgery. HOME CARE INSTRUCTIONS   Only take over-the-counter or prescription medicines for pain, discomfort, or fever as directed by your caregiver.  Put ice on the injured area.  Put ice in a plastic bag.  Place a towel between your skin and the bag.  Leave the ice on for 15-20 minutes at a time, 03-04 times a day.  Keep your leg raised (elevated) when possible to lessen swelling.  Avoid activities that cause ankle pain.  Follow specific exercises as directed by your caregiver.  Record how often you have ankle pain, the location of the pain, and what it feels like. This information may be helpful to you and your caregiver.  Ask your caregiver about returning to work or sports and whether you should drive.  Follow up with your caregiver for further examination, therapy, or testing as directed. SEEK MEDICAL CARE IF:   Pain or swelling continues or worsens beyond 1 week.  You have an oral temperature above 102 F (38.9 C).  You are feeling unwell or have chills.  You are having an increasingly difficult time with walking.  You have loss of sensation or other new symptoms.  You have questions or concerns. MAKE SURE YOU:   Understand these instructions.  Will watch your condition.  Will get help right away if you are not doing well or get worse.   This information is not intended to replace advice given to you by your health care provider. Make sure you discuss any questions you have with your health care provider.   Document Released: 10/22/2009 Document Revised:  07/27/2011 Document Reviewed: 12/04/2014 Elsevier Interactive Patient Education [employer]2016 [employer].

## 2015-04-22 NOTE — ED Provider Notes (Signed)
[facility] Emergency Department Provider Note  ____________________________________________  Time seen: Approximately 8:34 PM  I have reviewed the triage vital signs and the nursing notes.   HISTORY  Chief Complaint Ankle Pain    HPI Becky Park is a 19 y.o. female patient complaining of left ankle pain. Patient states she had injury in May of this year but nose evaluated. Patient states she was on a skateboard that hit a rock caused a twisting incident to her left ankle. Patient state she never sought medical care or evaluation for the injury. She state she had intermittent pain every since yesterday but it worsens today. States no provocative incident for increased pain today.   Past Medical History  Diagnosis Date  . Anxiety   . GERD (gastroesophageal reflux disease)     There are no active problems to display for this patient.   History reviewed. No pertinent past surgical history.  Current Outpatient Rx  Name  Route  Sig  Dispense  Refill  . famotidine (PEPCID) 40 MG tablet   Oral   Take 1 tablet (40 mg total) by mouth every evening.   30 tablet   1   . ranitidine (ZANTAC) 150 MG tablet   Oral   Take 1 tablet (150 mg total) by mouth at bedtime.   30 tablet   1     Allergies Review of patient's allergies indicates no known allergies.  No family history on file.  Social History Social History  Substance Use Topics  . Smoking status: Never Smoker   . Smokeless tobacco: None  . Alcohol Use: 2.4 oz/week    4 Glasses of wine per week    Review of Systems Constitutional: No fever/chills Eyes: No visual changes. ENT: No sore throat. Cardiovascular: Denies chest pain. Respiratory: Denies shortness of breath. Gastrointestinal: No abdominal pain.  No nausea, no vomiting.  No diarrhea.  No constipation. Genitourinary: Negative for dysuria. Musculoskeletal: Positive for left medial ankle pain Skin: Negative for  rash. Neurological: Negative for headaches, focal weakness or numbness. Psychiatric:Anxiety 10-point ROS otherwise negative.  ____________________________________________   PHYSICAL EXAM:  VITAL SIGNS: ED Triage Vitals  Enc Vitals Group     BP 04/22/15 2002 117/74 mmHg     Pulse Rate 04/22/15 2002 82     Resp 04/22/15 2002 18     Temp 04/22/15 2002 97.6 F (36.4 C)     Temp Source 04/22/15 2002 Oral     SpO2 04/22/15 2002 100 %     Weight 04/22/15 2002 110 lb (49.896 kg)     Height 04/22/15 2002 5\' 7"  (1.702 m)     Head Cir --      Peak Flow --      Pain Score 04/22/15 2005 2     Pain Loc --      Pain Edu? --      Excl. in GC? --     Constitutional: Alert and oriented. Well appearing and in no acute distress. Eyes: Conjunctivae are normal. PERRL. EOMI. Head: Atraumatic. Nose: No congestion/rhinnorhea. Mouth/Throat: Mucous membranes are moist.  Oropharynx non-erythematous. Neck: No stridor.  No cervical spine tenderness to palpation. Hematological/Lymphatic/Immunilogical: No cervical lymphadenopathy. Cardiovascular: Normal rate, regular rhythm. Grossly normal heart sounds.  Good peripheral circulation. Respiratory: Normal respiratory effort.  No retractions. Lungs CTAB. Gastrointestinal: Soft and nontender. No distention. No abdominal bruits. No CVA tenderness. Musculoskeletal: No obvious deformity of the left ankle. Obvious edema. No laxity with stress testing. Patient has some moderate  guarding of the medial malleolus. Neurologic:  Normal speech and language. No gross focal neurologic deficits are appreciated. No gait instability. Skin:  Skin is warm, dry and intact. No rash noted. Psychiatric: Mood and affect are normal. Speech and behavior are normal.  ____________________________________________   LABS (all labs ordered are listed, but only abnormal results are displayed)  Labs Reviewed - No data to  display ____________________________________________  EKG   ____________________________________________  RADIOLOGY  No acute findings. X-rays show old avulsion injury of the lateral aspect of the distal malleolus. I, Joni Reining, personally viewed and evaluated these images (plain radiographs) as part of my medical decision making.   ____________________________________________   PROCEDURES  Procedure(s) performed: None  Critical Care performed: No  ____________________________________________   INITIAL IMPRESSION / ASSESSMENT AND PLAN / ED COURSE  Pertinent labs & imaging results that were available during my care of the patient were reviewed by me and considered in my medical decision making (see chart for details).  Left ankle pain secondary to old avulsion injury. He advised to wear elastic ankle support was strenuous activity. Patient advised follow-up family doctor for continued complaint of dysphagia. ____________________________________________   FINAL CLINICAL IMPRESSION(S) / ED DIAGNOSES  Final diagnoses:  Left ankle pain      Joni Reining, PA-C 04/22/15 2125  Sharman Cheek, MD 04/23/15 [phone]

## 2015-04-22 NOTE — ED Notes (Signed)
Patient presents to ED for left ankle pain. Had injury in May of this year but never followed up with [facility]. Presents to ED today because it "started to hurt again." Denies re-injury but states pain is in the same area. No swelling or deformity noted. No ecchymosis noted.

## 2015-05-14 ENCOUNTER — Emergency Department
Admission: EM | Admit: 2015-05-14 | Discharge: 2015-05-14 | Disposition: A | Discharge status: Home / Self Care | Payer: Medicaid Other | Attending: Emergency Medicine | Admitting: Emergency Medicine

## 2015-05-14 DIAGNOSIS — J029 Acute pharyngitis, unspecified: Secondary | ICD-10-CM | POA: Insufficient documentation

## 2015-05-14 DIAGNOSIS — Z79899 Other long term (current) drug therapy: Secondary | ICD-10-CM | POA: Insufficient documentation

## 2015-05-14 MED ORDER — AZITHROMYCIN 250 MG PO TABS: ORAL_TABLET | ORAL | Status: DC

## 2015-05-14 MED ORDER — IBUPROFEN 800 MG PO TABS: 800.0000 mg | ORAL_TABLET | Freq: Three times a day (TID) | ORAL | Status: DC | PRN

## 2015-05-14 NOTE — ED Provider Notes (Signed)
CSN: [id number]     Arrival date & time 05/14/15  1659 History   First MD Initiated Contact with Patient 05/14/15 1757     Chief Complaint  Patient presents with  . Sore Throat     (Consider location/radiation/quality/duration/timing/severity/associated sxs/prior Treatment) HPI  19 year old female presents to emergency department for evaluation of sore throat. Patient has had sore throat for 3 days. She has moderate pain with swallowing. She states she does have a cough but it is very mild. She has very mild runny nose with congestion. She does have mild headache. No rashes, mild body aches. She denies any known contacts with strep. She does notice chills but no fevers. She is tolerating by mouth well. No nausea vomiting or diarrhea abdominal pain chest pain shortness of breath.  Past Medical History  Diagnosis Date  . Anxiety   . GERD (gastroesophageal reflux disease)    History reviewed. No pertinent past surgical history. No family history on file. Social History  Substance Use Topics  . Smoking status: Never Smoker   . Smokeless tobacco: None  . Alcohol Use: 2.4 oz/week    4 Glasses of wine per week   OB History    No data available     Review of Systems  Constitutional: Negative for fever, chills, activity change and fatigue.  HENT: Positive for congestion and sore throat. Negative for sinus pressure.   Eyes: Negative for visual disturbance.  Respiratory: Negative for cough, chest tightness and shortness of breath.   Cardiovascular: Negative for chest pain and leg swelling.  Gastrointestinal: Negative for nausea, vomiting, abdominal pain and diarrhea.  Genitourinary: Negative for dysuria.  Musculoskeletal: Negative for arthralgias and gait problem.  Skin: Negative for rash.  Neurological: Negative for weakness, numbness and headaches.  Hematological: Negative for adenopathy.  Psychiatric/Behavioral: Negative for behavioral problems, confusion and agitation.       Allergies  Review of patient's allergies indicates no known allergies.  Home Medications   Prior to Admission medications   Medication Sig Start Date End Date Taking? Authorizing Provider  azithromycin (ZITHROMAX Z-PAK) 250 MG tablet Take 2 tablets (500 mg) on  Day 1,  followed by 1 tablet (250 mg) once daily on Days 2 through 5. 05/14/15   Evon Slack, PA-C  famotidine (PEPCID) 40 MG tablet Take 1 tablet (40 mg total) by mouth every evening. 09/16/14 09/16/15  Myrna Blazer, MD  ibuprofen (ADVIL,MOTRIN) 800 MG tablet Take 1 tablet (800 mg total) by mouth every 8 (eight) hours as needed. 05/14/15   Evon Slack, PA-C  ranitidine (ZANTAC) 150 MG tablet Take 1 tablet (150 mg total) by mouth at bedtime. 01/23/15 01/23/16  [name], MD   BP 117/79 mmHg  Pulse 80  Temp(Src) 98.2 F (36.8 C) (Oral)  Resp 16  Ht  (1.702 m)  Wt 46.267 kg  BMI 15.97 kg/m2  SpO2 100%  LMP 04/22/2015 Physical Exam  Constitutional: She is oriented to person, place, and time. She appears well-developed and well-nourished. No distress.  HENT:  Head: Normocephalic and atraumatic.  Mouth/Throat: No oral lesions. No trismus in the jaw. No dental abscesses, uvula swelling or dental caries. Oropharyngeal exudate and posterior oropharyngeal erythema present. No tonsillar abscesses.  Eyes: EOM are normal. Pupils are equal, round, and reactive to light. Right eye exhibits no discharge. Left eye exhibits no discharge.  Neck: Normal range of motion. Neck supple.  Cardiovascular: Normal rate, regular rhythm and intact distal pulses.   Pulmonary/Chest: Effort  normal and breath sounds normal. No respiratory distress. She exhibits no tenderness.  Abdominal: Soft. She exhibits no distension. There is no tenderness.  Musculoskeletal: Normal range of motion. She exhibits no edema.  Lymphadenopathy:    She has cervical adenopathy (anterior).  Neurological: She is alert and oriented to person,  place, and time. She has normal reflexes.  Skin: Skin is warm and dry.  Psychiatric: She has a normal mood and affect. Her behavior is normal. Thought content normal.    ED Course  Procedures (including critical care time) Labs Review Labs Reviewed - No data to display  Imaging Review No results found. I have personally reviewed and evaluated these images and lab results as part of my medical decision-making.   EKG Interpretation None      MDM   Final diagnoses:  Acute pharyngitis, unspecified pharyngitis type    19 year old female with 3 days of pharyngeal erythema with exudates. Chest moderate sore throat pain. Mild cough and congestion. On exam, tender anterior cervical lymphadenopathy. She is educated on viral and bacterial pharyngitis. She agrees to treatment with azithromycin 500 mg by mouth times one day followed by 250 mg daily for 4 days. She will take ibuprofen as needed for inflammation and pain and fevers. Return to the ER for any worsening symptoms urgent changes in her health.  Evon Slackhomas C Gaines, PA-C 05/14/15 1821  Jennye MoccasinBrian S Quigley, MD 05/14/15 [phone]

## 2015-05-14 NOTE — Discharge Instructions (Signed)
Pharyngitis °Pharyngitis is redness, pain, and swelling (inflammation) of your pharynx.  °CAUSES  °Pharyngitis is usually caused by infection. Most of the time, these infections are from viruses (viral) and are part of a cold. However, sometimes pharyngitis is caused by bacteria (bacterial). Pharyngitis can also be caused by allergies. Viral pharyngitis may be spread from person to person by coughing, sneezing, and personal items or utensils (cups, forks, spoons, toothbrushes). Bacterial pharyngitis may be spread from person to person by more intimate contact, such as kissing.  °SIGNS AND SYMPTOMS  °Symptoms of pharyngitis include:   °· Sore throat.   °· Tiredness (fatigue).   °· Low-grade fever.   °· Headache. °· Joint pain and muscle aches. °· Skin rashes. °· Swollen lymph nodes. °· Plaque-like film on throat or tonsils (often seen with bacterial pharyngitis). °DIAGNOSIS  °Your health care provider will ask you questions about your illness and your symptoms. Your medical history, along with a physical exam, is often all that is needed to diagnose pharyngitis. Sometimes, a rapid strep test is done. Other lab tests may also be done, depending on the suspected cause.  °TREATMENT  °Viral pharyngitis will usually get better in 3-4 days without the use of medicine. Bacterial pharyngitis is treated with medicines that kill germs (antibiotics).  °HOME CARE INSTRUCTIONS  °· Drink enough water and fluids to keep your urine clear or pale yellow.   °· Only take over-the-counter or prescription medicines as directed by your health care provider:   °· If you are prescribed antibiotics, make sure you finish them even if you start to feel better.   °· Do not take aspirin.   °· Get lots of rest.   °· Gargle with 8 oz of salt water (½ tsp of salt per 1 qt of water) as often as every 1-2 hours to soothe your throat.   °· Throat lozenges (if you are not at risk for choking) or sprays may be used to soothe your throat. °SEEK MEDICAL  CARE IF:  °· You have large, tender lumps in your neck. °· You have a rash. °· You cough up green, yellow-brown, or bloody spit. °SEEK IMMEDIATE MEDICAL CARE IF:  °· Your neck becomes stiff. °· You drool or are unable to swallow liquids. °· You vomit or are unable to keep medicines or liquids down. °· You have severe pain that does not go away with the use of recommended medicines. °· You have trouble breathing (not caused by a stuffy nose). °MAKE SURE YOU:  °· Understand these instructions. °· Will watch your condition. °· Will get help right away if you are not doing well or get worse. °  °This information is not intended to replace advice given to you by your health care provider. Make sure you discuss any questions you have with your health care provider. °  °Document Released: 05/04/2005 Document Revised: 02/22/2013 Document Reviewed: 01/09/2013 °Elsevier Interactive Patient Education ©2016 [employer] Inc[employer] ° °Sore Throat °A sore throat is a painful, burning, sore, or scratchy feeling of the throat. There may be pain or tenderness when swallowing or talking. You may have other symptoms with a sore throat. These include coughing, sneezing, fever, or a swollen neck. A sore throat is often the first sign of another sickness. These sicknesses may include a cold, flu, strep throat, or an infection called mono. Most sore throats go away without medical treatment.  °HOME CARE  °· Only take medicine as told by your doctor. °· Drink enough fluids to keep your pee (urine) clear or pale yellow. °·   Rest as needed. °· Try using throat sprays, lozenges, or suck on hard candy (if older than 4 years or as told). °· Sip warm liquids, such as broth, herbal tea, or warm water with honey. Try sucking on frozen ice pops or drinking cold liquids. °· Rinse the mouth (gargle) with salt water. Mix 1 teaspoon salt with 8 ounces of water. °· Do not smoke. Avoid being around others when they are smoking. °· Put a humidifier in your bedroom  at night to moisten the air. You can also turn on a hot shower and sit in the bathroom for 5-10 minutes. Be sure the bathroom door is closed. °GET HELP RIGHT AWAY IF:  °· You have trouble breathing. °· You cannot swallow fluids, soft foods, or your spit (saliva). °· You have more puffiness (swelling) in the throat. °· Your sore throat does not get better in 7 days. °· You feel sick to your stomach (nauseous) and throw up (vomit). °· You have a fever or lasting symptoms for more than 2-3 days. °· You have a fever and your symptoms suddenly get worse. °MAKE SURE YOU:  °· Understand these instructions. °· Will watch your condition. °· Will get help right away if you are not doing well or get worse. °  °This information is not intended to replace advice given to you by your health care provider. Make sure you discuss any questions you have with your health care provider. °  °Document Released: 02/11/2008 Document Revised: 01/27/2012 Document Reviewed: 01/10/2012 °Elsevier Interactive Patient Education ©2016 [employer] Inc. ° °

## 2015-05-14 NOTE — ED Notes (Signed)
Pt discharged home after verbalizing understanding of discharge instructions; nad noted. 

## 2015-05-14 NOTE — ED Notes (Signed)
Pt reports pus pockets on her tonsils x 3 days. She states it is painful to swallow. She has also had cough.Pt alert and oriented with NAD noted.

## 2015-05-14 NOTE — ED Notes (Signed)
Pt c/o sore throat for the past 3 days, denies fever. States there is "like puss" on them

## 2015-05-21 ENCOUNTER — Encounter: Payer: Self-pay | Admitting: Emergency Medicine

## 2015-05-21 ENCOUNTER — Emergency Department
Admission: EM | Admit: 2015-05-21 | Discharge: 2015-05-21 | Disposition: A | Discharge status: Home / Self Care | Payer: Medicaid Other | Attending: Emergency Medicine | Admitting: Emergency Medicine

## 2015-05-21 DIAGNOSIS — N760 Acute vaginitis: Secondary | ICD-10-CM | POA: Insufficient documentation

## 2015-05-21 DIAGNOSIS — Z79899 Other long term (current) drug therapy: Secondary | ICD-10-CM | POA: Insufficient documentation

## 2015-05-21 DIAGNOSIS — Z3202 Encounter for pregnancy test, result negative: Secondary | ICD-10-CM | POA: Insufficient documentation

## 2015-05-21 LAB — URINALYSIS COMPLETE WITH MICROSCOPIC (ARMC ONLY)
Bacteria, UA: NONE SEEN
Bilirubin Urine: NEGATIVE
GLUCOSE, UA: NEGATIVE mg/dL
Hgb urine dipstick: NEGATIVE
Ketones, ur: NEGATIVE mg/dL
Leukocytes, UA: NEGATIVE
Nitrite: NEGATIVE
Protein, ur: NEGATIVE mg/dL
Specific Gravity, Urine: 1.028 (ref 1.005–1.030)
pH: 6 (ref 5.0–8.0)

## 2015-05-21 LAB — WET PREP, GENITAL
CLUE CELLS WET PREP: NONE SEEN
Sperm: NONE SEEN
Trich, Wet Prep: NONE SEEN
Yeast Wet Prep HPF POC: NONE SEEN

## 2015-05-21 LAB — CHLAMYDIA/NGC RT PCR (ARMC ONLY)
CHLAMYDIA TR: NOT DETECTED
N gonorrhoeae: NOT DETECTED

## 2015-05-21 LAB — POCT PREGNANCY, URINE: Preg Test, Ur: NEGATIVE

## 2015-05-21 MED ORDER — FLUCONAZOLE 50 MG PO TABS
150.0000 mg | ORAL_TABLET | Freq: Once | ORAL | Status: AC
  Administered 2015-05-21: 150 mg via ORAL
  Filled 2015-05-21: qty 1

## 2015-05-21 NOTE — ED Notes (Signed)
Presents with some painful urination with some vaginal itching

## 2015-05-21 NOTE — ED Notes (Signed)
Pt c/o burning w/ urination.  Pt denies discharge.  Pt denies pain

## 2015-05-21 NOTE — Discharge Instructions (Signed)

## 2015-05-21 NOTE — ED Notes (Addendum)
Specimens were collected but Pt was not able to tolerate pelvic exam.

## 2015-05-21 NOTE — ED Provider Notes (Signed)
CSN: [id number]     Arrival date & time 05/21/15  1800 History   First MD Initiated Contact with Patient 05/21/15 1944     Chief Complaint  Patient presents with  . Dysuria     (Consider location/radiation/quality/duration/timing/severity/associated sxs/prior Treatment) HPI  20 year old female presents to emergency department for complaints of burning with urination. She states she's had a lot of itching and burning with urination. She denies any vaginal discharge. Symptoms have been present for 1 week, she recently finished antibiotics for strep pharyngitis, sore throat has resolved she denies any rashes. Patient denies any pelvic pain or abdominal pain. Vaginal itching has been increasing over the last few days. She has had yeast infections in the past. Patient has had sexual intercourse with a new partner, she is concerned about possible infection   Past Medical History  Diagnosis Date  . Anxiety   . GERD (gastroesophageal reflux disease)    History reviewed. No pertinent past surgical history. No family history on file. Social History  Substance Use Topics  . Smoking status: Never Smoker   . Smokeless tobacco: None  . Alcohol Use: 2.4 oz/week    4 Glasses of wine per week   OB History    No data available     Review of Systems  Constitutional: Negative for fever, chills, activity change and fatigue.  HENT: Negative for congestion, sinus pressure and sore throat.   Eyes: Negative for visual disturbance.  Respiratory: Negative for cough, chest tightness and shortness of breath.   Cardiovascular: Negative for chest pain and leg swelling.  Gastrointestinal: Negative for nausea, vomiting, abdominal pain and diarrhea.  Genitourinary: Positive for dysuria, frequency and vaginal pain (tching). Negative for vaginal bleeding, vaginal discharge and dyspareunia.  Musculoskeletal: Negative for arthralgias and gait problem.  Skin: Negative for rash.  Neurological: Negative for  weakness, numbness and headaches.  Hematological: Negative for adenopathy.  Psychiatric/Behavioral: Negative for behavioral problems, confusion and agitation.      Allergies  Review of patient's allergies indicates no known allergies.  Home Medications   Prior to Admission medications   Medication Sig Start Date End Date Taking? Authorizing Provider  azithromycin (ZITHROMAX Z-PAK) 250 MG tablet Take 2 tablets (500 mg) on  Day 1,  followed by 1 tablet (250 mg) once daily on Days 2 through 5. 05/14/15   Evon Slack, PA-C  famotidine (PEPCID) 40 MG tablet Take 1 tablet (40 mg total) by mouth every evening. 09/16/14 09/16/15  Myrna Blazer, MD  ibuprofen (ADVIL,MOTRIN) 800 MG tablet Take 1 tablet (800 mg total) by mouth every 8 (eight) hours as needed. 05/14/15   Evon Slack, PA-C  ranitidine (ZANTAC) 150 MG tablet Take 1 tablet (150 mg total) by mouth at bedtime. 01/23/15 01/23/16  [name], MD   BP 127/59 mmHg  Pulse 86  Temp(Src) 98.3 F (36.8 C)  Resp 20  Ht 5\' 4"  (1.626 m)  Wt 46.267 kg  BMI 17.50 kg/m2  SpO2 100%  LMP 04/22/2015 Physical Exam  Constitutional: She is oriented to person, place, and time. She appears well-developed and well-nourished. No distress.  HENT:  Head: Normocephalic and atraumatic.  Mouth/Throat: Oropharynx is clear and moist.  Eyes: EOM are normal. Pupils are equal, round, and reactive to light. Right eye exhibits no discharge. Left eye exhibits no discharge.  Neck: Normal range of motion. Neck supple.  Cardiovascular: Normal rate, regular rhythm and intact distal pulses.   Pulmonary/Chest: Effort normal and breath sounds normal. No  respiratory distress. She has no wheezes. She has no rales. She exhibits no tenderness.  Abdominal: Soft. She exhibits no distension and no mass. There is no tenderness. There is no rebound and no guarding.  Genitourinary: There is no rash, tenderness, lesion or injury on the right labia. There is no  rash, tenderness, lesion or injury on the left labia. Cervix exhibits no motion tenderness and no discharge. Right adnexum displays no mass. Left adnexum displays no mass. No foreign body around the vagina. There are signs of injury in the vagina. Vaginal discharge (Mild white thick discharge, no odor) found.  Musculoskeletal: Normal range of motion. She exhibits no edema.  Neurological: She is alert and oriented to person, place, and time. She has normal reflexes.  Skin: Skin is warm and dry.  Psychiatric: She has a normal mood and affect. Her behavior is normal. Thought content normal.    ED Course  Procedures (including critical care time) Labs Review Labs Reviewed  WET PREP, GENITAL - Abnormal; Notable for the following:    WBC, Wet Prep HPF POC FEW (*)    All other components within normal limits  URINALYSIS COMPLETEWITH MICROSCOPIC ([facility] ONLY) - Abnormal; Notable for the following:    Color, Urine YELLOW (*)    APPearance CLEAR (*)    Squamous Epithelial / LPF 0-5 (*)    All other components within normal limits  CHLAMYDIA/NGC RT PCR ([facility] ONLY)  POC URINE PREG, ED  POCT PREGNANCY, URINE    Imaging Review No results found. I have personally reviewed and evaluated these images and lab results as part of my medical decision-making.   EKG Interpretation None      MDM   Final diagnoses:  Vaginitis    20 year old female with dysuria, pruritus and mild white discharge. She recently completed a 10 day course of antibiotics. Urinalysis was normal. Vaginal exam was normal except for mild white discharge. Diarrhea chlamydia swab is pending. Wet prep was negative, exam and history indicate possible yeast vaginitis, we'll treat with fluconazole 150 mg by mouth 1. Return to the ER for any worsening symptoms urgent changes in her health.    Evon Slackhomas C Gaines, PA-C 05/21/15 2045  Arnaldo NatalPaul F Malinda, MD 05/22/15 [phone]

## 2015-05-30 ENCOUNTER — Encounter: Payer: Self-pay | Admitting: *Deleted

## 2015-05-30 ENCOUNTER — Emergency Department
Admission: EM | Admit: 2015-05-30 | Discharge: 2015-05-30 | Disposition: A | Discharge status: Home / Self Care | Payer: Medicaid Other | Attending: Emergency Medicine | Admitting: Emergency Medicine

## 2015-05-30 DIAGNOSIS — W268XXA Contact with other sharp object(s), not elsewhere classified, initial encounter: Secondary | ICD-10-CM | POA: Insufficient documentation

## 2015-05-30 DIAGNOSIS — IMO0002 Reserved for concepts with insufficient information to code with codable children: Secondary | ICD-10-CM

## 2015-05-30 DIAGNOSIS — Y990 Civilian activity done for income or pay: Secondary | ICD-10-CM | POA: Insufficient documentation

## 2015-05-30 DIAGNOSIS — Y9289 Other specified places as the place of occurrence of the external cause: Secondary | ICD-10-CM | POA: Insufficient documentation

## 2015-05-30 DIAGNOSIS — Y9389 Activity, other specified: Secondary | ICD-10-CM | POA: Insufficient documentation

## 2015-05-30 DIAGNOSIS — Z79899 Other long term (current) drug therapy: Secondary | ICD-10-CM | POA: Insufficient documentation

## 2015-05-30 DIAGNOSIS — J069 Acute upper respiratory infection, unspecified: Secondary | ICD-10-CM

## 2015-05-30 DIAGNOSIS — S71112A Laceration without foreign body, left thigh, initial encounter: Secondary | ICD-10-CM | POA: Insufficient documentation

## 2015-05-30 LAB — POCT RAPID STREP A: Streptococcus, Group A Screen (Direct): NEGATIVE

## 2015-05-30 MED ORDER — IBUPROFEN 800 MG PO TABS: 800.0000 mg | ORAL_TABLET | Freq: Three times a day (TID) | ORAL | Status: DC | PRN

## 2015-05-30 MED ORDER — LIDOCAINE HCL (PF) 1 % IJ SOLN
5.0000 mL | Freq: Once | INTRAMUSCULAR | Status: AC
  Administered 2015-05-30: 5 mL
  Filled 2015-05-30: qty 5

## 2015-05-30 NOTE — ED Provider Notes (Signed)
[facility] Emergency Department Provider Note  ____________________________________________  Time seen: Approximately 8:14 PM  I have reviewed the triage vital signs and the nursing notes.   HISTORY  Chief Complaint Sore Throat    HPI Becky Park is a 20 y.o. female who presents today with sore throat beginning today. She denies fevers chills, ear pain or headache. She denies cough or congestion. She denies abdominal pain or rash. She was seen last week for vaginitis, itching in the vaginal area and treated with Diflucan 1. She was also given some cream. She had negative wet prep, except for white blood cells. She had negative gonorrhea chlamydia testing.She also reports cutting her left leg with a box cutter earlier today at work. She has a laceration approximate 1.5 cm long.   Past Medical History  Diagnosis Date  . Anxiety   . GERD (gastroesophageal reflux disease)     There are no active problems to display for this patient.   History reviewed. No pertinent past surgical history.  Current Outpatient Rx  Name  Route  Sig  Dispense  Refill  . azithromycin (ZITHROMAX Z-PAK) 250 MG tablet      Take 2 tablets (500 mg) on  Day 1,  followed by 1 tablet (250 mg) once daily on Days 2 through 5.   6 each   0   . famotidine (PEPCID) 40 MG tablet   Oral   Take 1 tablet (40 mg total) by mouth every evening.   30 tablet   1   . ibuprofen (ADVIL,MOTRIN) 800 MG tablet   Oral   Take 1 tablet (800 mg total) by mouth every 8 (eight) hours as needed.   15 tablet   0   . ranitidine (ZANTAC) 150 MG tablet   Oral   Take 1 tablet (150 mg total) by mouth at bedtime.   30 tablet   1     Allergies Review of patient's allergies indicates no known allergies.  History reviewed. No pertinent family history.  Social History Social History  Substance Use Topics  . Smoking status: Never Smoker   . Smokeless tobacco: None  . Alcohol Use: 2.4 oz/week     4 Glasses of wine per week    Review of Systems Constitutional: No fever/chills Eyes: No visual changes. ENT: per HPI Cardiovascular: Denies chest pain. Respiratory: Denies shortness of breath. Gastrointestinal: No abdominal pain.  No nausea, no vomiting.  No diarrhea.  No constipation. Genitourinary: Negative for dysuria.  Per HPI Musculoskeletal: Negative for back pain. Skin: Negative for rash. Neurological: Negative for headaches, focal weakness or numbness. 10-point ROS otherwise negative.  ____________________________________________   PHYSICAL EXAM:  VITAL SIGNS: ED Triage Vitals  Enc Vitals Group     BP 05/30/15 1849 115/81 mmHg     Pulse Rate 05/30/15 1849 82     Resp --      Temp 05/30/15 1849 98.5 F (36.9 C)     Temp Source 05/30/15 1849 Oral     SpO2 05/30/15 1849 100 %     Weight 05/30/15 1849 110 lb (49.896 kg)     Height 05/30/15 1849 5\' 7"  (1.702 m)     Head Cir --      Peak Flow --      Pain Score 05/30/15 1850 1     Pain Loc --      Pain Edu? --      Excl. in GC? --     Constitutional: Alert and  oriented. Well appearing and in no acute distress. Eyes: Conjunctivae are normal. PERRL. EOMI. Ears:  Clear with normal landmarks. No erythema. Head: Atraumatic. Nose: No congestion/rhinnorhea. Mouth/Throat: Mucous membranes are moist.  Oropharynx non-erythematous. No lesions. Neck:  Supple.  No adenopathy.   Cardiovascular: Normal rate, regular rhythm. Grossly normal heart sounds.  Good peripheral circulation. Respiratory: Normal respiratory effort.  No retractions. Lungs CTAB. Gastrointestinal: Soft and nontender. No distention. No abdominal bruits. No CVA tenderness. Musculoskeletal: Nml ROM of upper and lower extremity joints. Neurologic:  Normal speech and language. No gross focal neurologic deficits are appreciated. No gait instability. Skin:  Skin is warm, dry and intact. No rash noted. Psychiatric: Mood and affect are normal. Speech and  behavior are normal.  ____________________________________________   LABS (all labs ordered are listed, but only abnormal results are displayed)  Labs Reviewed  POCT RAPID STREP A   ____________________________________________  EKG   ____________________________________________  RADIOLOGY   ____________________________________________   PROCEDURES  Procedure(s) performed: LACERATION REPAIR Performed by: Ignacia Bayleyobert Laurelle Skiver Authorized by: Ignacia Bayleyobert Asia Dusenbury Consent: Verbal consent obtained. Risks and benefits: risks, benefits and alternatives were discussed Consent given by: patient Patient identity confirmed: provided demographic data Prepped and Draped in normal sterile fashion Wound explored  Laceration Location: left thigh  Laceration Length: 1.5cm  No Foreign Bodies seen or palpated  Anesthesia: local infiltration  Local anesthetic: lidocaine 1% without epinephrine  Anesthetic total: 3 ml  Irrigation method: syringe Amount of cleaning: standard  Skin closure: nylon  Number of sutures: 3  Technique: simple interrupted  Patient tolerance: Patient tolerated the procedure well with no immediate complications.   Critical Care performed: No  ____________________________________________   INITIAL IMPRESSION / ASSESSMENT AND PLAN / ED COURSE  Pertinent labs & imaging results that were available during my care of the patient were reviewed by me and considered in my medical decision making (see chart for details).  20 year old with acute onset of sore throat today. Negative rapid strep. Suspect early onset of URI. Can take  ibuprofen or over-the-counter medicines as needed. Follow-up with urgent care for worsening symptoms she also has a laceration to the right distal thigh. Closure as procedure above. She will return in approximate 10 days for suture removal. Instructed in laceration care. ____________________________________________   FINAL CLINICAL  IMPRESSION(S) / ED DIAGNOSES  Final diagnoses:  URI, acute      Ignacia BayleyRobert Ashraf Mesta, PA-C 05/30/15 2039  Ignacia Bayleyobert Jayvin Hurrell, PA-C 05/30/15 2115  Minna AntisKevin Paduchowski, MD 05/30/15 2310

## 2015-05-30 NOTE — Discharge Instructions (Signed)
Upper Respiratory Infection, Adult Most upper respiratory infections (URIs) are a viral infection of the air passages leading to the lungs. A URI affects the nose, throat, and upper air passages. The most common type of URI is nasopharyngitis and is typically referred to as "the common cold." URIs run their course and usually go away on their own. Most of the time, a URI does not require medical attention, but sometimes a bacterial infection in the upper airways can follow a viral infection. This is called a secondary infection. Sinus and middle ear infections are common types of secondary upper respiratory infections. Bacterial pneumonia can also complicate a URI. A URI can worsen asthma and chronic obstructive pulmonary disease (COPD). Sometimes, these complications can require emergency medical care and may be life threatening.  CAUSES Almost all URIs are caused by viruses. A virus is a type of germ and can spread from one person to another.  RISKS FACTORS You may be at risk for a URI if:   You smoke.   You have chronic heart or lung disease.  You have a weakened defense (immune) system.   You are very young or very old.   You have nasal allergies or asthma.  You work in crowded or poorly ventilated areas.  You work in health care facilities or schools. SIGNS AND SYMPTOMS  Symptoms typically develop 2-3 days after you come in contact with a cold virus. Most viral URIs last 7-10 days. However, viral URIs from the influenza virus (flu virus) can last 14-18 days and are typically more severe. Symptoms may include:   Runny or stuffy (congested) nose.   Sneezing.   Cough.   Sore throat.   Headache.   Fatigue.   Fever.   Loss of appetite.   Pain in your forehead, behind your eyes, and over your cheekbones (sinus pain).  Muscle aches.  DIAGNOSIS  Your health care provider may diagnose a URI by:  Physical exam.  Tests to check that your symptoms are not due to  another condition such as:  Strep throat.  Sinusitis.  Pneumonia.  Asthma. TREATMENT  A URI goes away on its own with time. It cannot be cured with medicines, but medicines may be prescribed or recommended to relieve symptoms. Medicines may help:  Reduce your fever.  Reduce your cough.  Relieve nasal congestion. HOME CARE INSTRUCTIONS   Take medicines only as directed by your health care provider.   Gargle warm saltwater or take cough drops to comfort your throat as directed by your health care provider.  Use a warm mist humidifier or inhale steam from a shower to increase air moisture. This may make it easier to breathe.  Drink enough fluid to keep your urine clear or pale yellow.   Eat soups and other clear broths and maintain good nutrition.   Rest as needed.   Return to work when your temperature has returned to normal or as your health care provider advises. You may need to stay home longer to avoid infecting others. You can also use a face mask and careful hand washing to prevent spread of the virus.  Increase the usage of your inhaler if you have asthma.   Do not use any tobacco products, including cigarettes, chewing tobacco, or electronic cigarettes. If you need help quitting, ask your health care provider. PREVENTION  The best way to protect yourself from getting a cold is to practice good hygiene.   Avoid oral or hand contact with people with cold  symptoms.   Wash your hands often if contact occurs.  There is no clear evidence that vitamin C, vitamin E, echinacea, or exercise reduces the chance of developing a cold. However, it is always recommended to get plenty of rest, exercise, and practice good nutrition.  SEEK MEDICAL CARE IF:   You are getting worse rather than better.   Your symptoms are not controlled by medicine.   You have chills.  You have worsening shortness of breath.  You have brown or red mucus.  You have yellow or brown nasal  discharge.  You have pain in your face, especially when you bend forward.  You have a fever.  You have swollen neck glands.  You have pain while swallowing.  You have white areas in the back of your throat. SEEK IMMEDIATE MEDICAL CARE IF:   You have severe or persistent:  Headache.  Ear pain.  Sinus pain.  Chest pain.  You have chronic lung disease and any of the following:  Wheezing.  Prolonged cough.  Coughing up blood.  A change in your usual mucus.  You have a stiff neck.  You have changes in your:  Vision.  Hearing.  Thinking.  Mood. MAKE SURE YOU:   Understand these instructions.  Will watch your condition.  Will get help right away if you are not doing well or get worse.   This information is not intended to replace advice given to you by your health care provider. Make sure you discuss any questions you have with your health care provider.   Document Released: 10/28/2000 Document Revised: 09/18/2014 Document Reviewed: 08/09/2013 Elsevier Interactive Patient Education 2016 [employer]   You may take over-the-counter medicines as needed. Also can try ibuprofen for discomfort. Follow-up with your physician or urgent care if symptoms are worsening. Return to the Emergency room in about 10 days for suture removal. Watch for signs of infection to the sutured area.

## 2015-05-30 NOTE — ED Notes (Signed)
States sore throat starting today

## 2015-06-05 ENCOUNTER — Emergency Department
Admission: EM | Admit: 2015-06-05 | Discharge: 2015-06-05 | Disposition: A | Discharge status: Home / Self Care | Payer: Medicaid Other | Attending: Emergency Medicine | Admitting: Emergency Medicine

## 2015-06-05 ENCOUNTER — Encounter: Payer: Self-pay | Admitting: *Deleted

## 2015-06-05 DIAGNOSIS — N760 Acute vaginitis: Secondary | ICD-10-CM | POA: Insufficient documentation

## 2015-06-05 DIAGNOSIS — Z79899 Other long term (current) drug therapy: Secondary | ICD-10-CM | POA: Diagnosis not present

## 2015-06-05 DIAGNOSIS — R21 Rash and other nonspecific skin eruption: Secondary | ICD-10-CM | POA: Diagnosis present

## 2015-06-05 LAB — WET PREP, GENITAL
CLUE CELLS WET PREP: NONE SEEN
Sperm: NONE SEEN
TRICH WET PREP: NONE SEEN
YEAST WET PREP: NONE SEEN

## 2015-06-05 MED ORDER — KETOCONAZOLE 2 % EX CREA: 1.0000 "application " | TOPICAL_CREAM | Freq: Two times a day (BID) | CUTANEOUS | Status: DC

## 2015-06-05 MED ORDER — FLUCONAZOLE 150 MG PO TABS: 150.0000 mg | ORAL_TABLET | Freq: Once | ORAL | Status: DC

## 2015-06-05 NOTE — ED Notes (Signed)
Pt has a rash on left hand and lower back with itching.  Pt also taking monistat for yeast infection.

## 2015-06-05 NOTE — Discharge Instructions (Signed)
Vaginitis Vaginitis is an inflammation of the vagina. It is most often caused by a change in the normal balance of the bacteria and yeast that live in the vagina. This change in balance causes an overgrowth of certain bacteria or yeast, which causes the inflammation. There are different types of vaginitis, but the most common types are:  Bacterial vaginosis.  Yeast infection (candidiasis).  Trichomoniasis vaginitis. This is a sexually transmitted infection (STI).  Viral vaginitis.  Atrophic vaginitis.  Allergic vaginitis. CAUSES  The cause depends on the type of vaginitis. Vaginitis can be caused by:  Bacteria (bacterial vaginosis).  Yeast (yeast infection).  A parasite (trichomoniasis vaginitis)  A virus (viral vaginitis).  Low hormone levels (atrophic vaginitis). Low hormone levels can occur during pregnancy, breastfeeding, or after menopause.  Irritants, such as bubble baths, scented tampons, and feminine sprays (allergic vaginitis). Other factors can change the normal balance of the yeast and bacteria that live in the vagina. These include:  Antibiotic medicines.  Poor hygiene.  Diaphragms, vaginal sponges, spermicides, birth control pills, and intrauterine devices (IUD).  Sexual intercourse.  Infection.  Uncontrolled diabetes.  A weakened immune system. SYMPTOMS  Symptoms can vary depending on the cause of the vaginitis. Common symptoms include:  Abnormal vaginal discharge.  The discharge is white, gray, or yellow with bacterial vaginosis.  The discharge is thick, white, and cheesy with a yeast infection.  The discharge is frothy and yellow or greenish with trichomoniasis.  A bad vaginal odor.  The odor is fishy with bacterial vaginosis.  Vaginal itching, pain, or swelling.  Painful intercourse.  Pain or burning when urinating. Sometimes, there are no symptoms. TREATMENT  Treatment will vary depending on the type of infection.   Bacterial  vaginosis and trichomoniasis are often treated with antibiotic creams or pills.  Yeast infections are often treated with antifungal medicines, such as vaginal creams or suppositories.  Viral vaginitis has no cure, but symptoms can be treated with medicines that relieve discomfort. Your sexual partner should be treated as well.  Atrophic vaginitis may be treated with an estrogen cream, pill, suppository, or vaginal ring. If vaginal dryness occurs, lubricants and moisturizing creams may help. You may be told to avoid scented soaps, sprays, or douches.  Allergic vaginitis treatment involves quitting the use of the product that is causing the problem. Vaginal creams can be used to treat the symptoms. HOME CARE INSTRUCTIONS   Take all medicines as directed by your caregiver.  Keep your genital area clean and dry. Avoid soap and only rinse the area with water.  Avoid douching. It can remove the healthy bacteria in the vagina.  Do not use tampons or have sexual intercourse until your vaginitis has been treated. Use sanitary pads while you have vaginitis.  Wipe from front to back. This avoids the spread of bacteria from the rectum to the vagina.  Let air reach your genital area.  Wear cotton underwear to decrease moisture buildup.  Avoid wearing underwear while you sleep until your vaginitis is gone.  Avoid tight pants and underwear or nylons without a cotton panel.  Take off wet clothing (especially bathing suits) as soon as possible.  Use mild, non-scented products. Avoid using irritants, such as:  Scented feminine sprays.  Fabric softeners.  Scented detergents.  Scented tampons.  Scented soaps or bubble baths.  Practice safe sex and use condoms. Condoms may prevent the spread of trichomoniasis and viral vaginitis. SEEK MEDICAL CARE IF:   You have abdominal pain.  You  have a fever or persistent symptoms for more than 2-3 days.  You have a fever and your symptoms suddenly  get worse.   This information is not intended to replace advice given to you by your health care provider. Make sure you discuss any questions you have with your health care provider.   Document Released: 03/01/2007 Document Revised: 09/18/2014 Document Reviewed: 10/15/2011 Elsevier Interactive Patient Education 2016 [employer].  You should take the prescription medications as directed. Apply OTC hydrocortisone cream to the hands and legs as needed. Follow-up with your provider as needed for ongoing symptoms.

## 2015-06-05 NOTE — ED Provider Notes (Signed)
[facility] Emergency Department Provider Note ____________________________________________  Time seen: 1645  I have reviewed the triage vital signs and the nursing notes.  HISTORY  Chief Complaint  Rash  HPI Becky Park is a 20 y.o. female to the ED for evaluation of a rash noted to her left hand and she is also concerned for some all over itching for the last few days. She denies attempting to use an over-the-counter yeast treatment, but admittedly has been unable to apply the vaginal inserts effectively. She admits to me that she is a virgin, and a practicing lesbian, she has never had a sexual encounter with a female partner.He fevers, chills, sweats. She also denies any vaginal discharge, abdominal pain, or pelvic pain.  Past Medical History  Diagnosis Date  . Anxiety   . GERD (gastroesophageal reflux disease)     There are no active problems to display for this patient.  No past surgical history on file.  Current Outpatient Rx  Name  Route  Sig  Dispense  Refill  . azithromycin (ZITHROMAX Z-PAK) 250 MG tablet      Take 2 tablets (500 mg) on  Day 1,  followed by 1 tablet (250 mg) once daily on Days 2 through 5.   6 each   0   . famotidine (PEPCID) 40 MG tablet   Oral   Take 1 tablet (40 mg total) by mouth every evening.   30 tablet   1   . fluconazole (DIFLUCAN) 150 MG tablet   Oral   Take 1 tablet (150 mg total) by mouth once.   1 tablet   1   . ibuprofen (ADVIL,MOTRIN) 800 MG tablet   Oral   Take 1 tablet (800 mg total) by mouth every 8 (eight) hours as needed.   15 tablet   0   . ketoconazole (NIZORAL) 2 % cream   Topical   Apply 1 application topically 2 (two) times daily.   30 g   0   . ranitidine (ZANTAC) 150 MG tablet   Oral   Take 1 tablet (150 mg total) by mouth at bedtime.   30 tablet   1    Allergies Review of patient's allergies indicates no known allergies.  No family history on file.  Social  History Social History  Substance Use Topics  . Smoking status: Never Smoker   . Smokeless tobacco: None  . Alcohol Use: 2.4 oz/week    4 Glasses of wine per week   Review of Systems  Constitutional: Negative for fever. Eyes: Negative for visual changes. ENT: Negative for sore throat. Cardiovascular: Negative for chest pain. Respiratory: Negative for shortness of breath. Gastrointestinal: Negative for abdominal pain, vomiting and diarrhea. Genitourinary: Negative for dysuria. Reports vulvar itching and white discharge Musculoskeletal: Negative for back pain. Skin: Negative for rash. Neurological: Negative for headaches, focal weakness or numbness. ____________________________________________  PHYSICAL EXAM:  VITAL SIGNS: ED Triage Vitals  Enc Vitals Group     BP 06/05/15 1634 121/69 mmHg     Pulse Rate 06/05/15 1634 86     Resp 06/05/15 1634 18     Temp 06/05/15 1634 98.6 F (37 C)     Temp Source 06/05/15 1634 Oral     SpO2 06/05/15 1634 99 %     Weight 06/05/15 1634 113 lb (51.256 kg)     Height 06/05/15 1634  (1.702 m)     Head Cir --      Peak Flow --  Pain Score --      Pain Loc --      Pain Edu? --      Excl. in GC? --    Constitutional: Alert and oriented. Well appearing and in no distress. Head: Normocephalic and atraumatic.      Eyes: Conjunctivae are normal. PERRL. Normal extraocular movements      Ears: Canals clear. TMs intact bilaterally.   Nose: No congestion/rhinorrhea.   Mouth/Throat: Mucous membranes are moist.   Neck: Supple. No thyromegaly. Hematological/Lymphatic/Immunological: No cervical lymphadenopathy. Cardiovascular: Normal rate, regular rhythm.  Respiratory: Normal respiratory effort. No wheezes/rales/rhonchi. Gastrointestinal: Soft and nontender. No distention, rebound or guarding.  GU: Normal external genitalia. Difficult speculum exam, even with pediatric speculum due to patient's anxiety. Patient with a thick, white  discharge in the canal. Cervix poorly visualized.  Musculoskeletal: Nontender with normal range of motion in all extremities.  Neurologic:  Normal gait without ataxia. Normal speech and language. No gross focal neurologic deficits are appreciated. Skin:  Skin is warm, dry and intact. No rash noted. Psychiatric: Mood and affect are normal. Patient exhibits appropriate insight and judgment. ____________________________________________   LABS (pertinent positives/negatives) Labs Reviewed  WET PREP, GENITAL - Abnormal; Notable for the following:    WBC, Wet Prep HPF POC FEW (*)    All other components within normal limits  ____________________________________________  INITIAL IMPRESSION / ASSESSMENT AND PLAN / ED COURSE  Vulvovaginitis likely due to yeast. Will again treat with Diflucan x 1. Patient also discharged with miconazole cream for topical application. She will follow-up with the [facility] Department for routine medical care and follow-up.  ____________________________________________  FINAL CLINICAL IMPRESSION(S) / ED DIAGNOSES  Final diagnoses:  Vulvovaginitis      Lissa Hoard, PA-C 06/05/15 1857  Sharman Cheek, MD 06/05/15 2316

## 2015-06-05 NOTE — ED Notes (Signed)
Pt has a rash on left hand and lower back for 1 day.  Pt has itching.  Pt also has been taking monistat for a yeast infection and wants to be checked for std's.  No vag bleeding.  No abd pain.

## 2015-06-09 ENCOUNTER — Emergency Department
Admission: EM | Admit: 2015-06-09 | Discharge: 2015-06-09 | Disposition: A | Discharge status: Home / Self Care | Payer: Medicaid Other | Attending: Emergency Medicine | Admitting: Emergency Medicine

## 2015-06-09 ENCOUNTER — Encounter: Payer: Self-pay | Admitting: Emergency Medicine

## 2015-06-09 DIAGNOSIS — Z79899 Other long term (current) drug therapy: Secondary | ICD-10-CM | POA: Diagnosis not present

## 2015-06-09 DIAGNOSIS — Z4802 Encounter for removal of sutures: Secondary | ICD-10-CM | POA: Diagnosis not present

## 2015-06-09 NOTE — ED Provider Notes (Signed)
[facility] Emergency Department Provider Note  ____________________________________________  Time seen: Approximately 5:12 PM  I have reviewed the triage vital signs and the nursing notes.   HISTORY  Chief Complaint Suture / Staple Removal  HPI Becky Park is a 20 y.o. female is here for suture removal.She was seen in the emergency room on 05/30/15 for laceration to the left distal thigh. Patient returns today for suture removal.   Past Medical History  Diagnosis Date  . Anxiety   . GERD (gastroesophageal reflux disease)     There are no active problems to display for this patient.   History reviewed. No pertinent past surgical history.  Current Outpatient Rx  Name  Route  Sig  Dispense  Refill  . azithromycin (ZITHROMAX Z-PAK) 250 MG tablet      Take 2 tablets (500 mg) on  Day 1,  followed by 1 tablet (250 mg) once daily on Days 2 through 5.   6 each   0   . famotidine (PEPCID) 40 MG tablet   Oral   Take 1 tablet (40 mg total) by mouth every evening.   30 tablet   1   . fluconazole (DIFLUCAN) 150 MG tablet   Oral   Take 1 tablet (150 mg total) by mouth once.   1 tablet   1   . ibuprofen (ADVIL,MOTRIN) 800 MG tablet   Oral   Take 1 tablet (800 mg total) by mouth every 8 (eight) hours as needed.   15 tablet   0   . ketoconazole (NIZORAL) 2 % cream   Topical   Apply 1 application topically 2 (two) times daily.   30 g   0   . ranitidine (ZANTAC) 150 MG tablet   Oral   Take 1 tablet (150 mg total) by mouth at bedtime.   30 tablet   1     Allergies Review of patient's allergies indicates no known allergies.  No family history on file.  Social History Social History  Substance Use Topics  . Smoking status: Never Smoker   . Smokeless tobacco: None  . Alcohol Use: 2.4 oz/week    4 Glasses of wine per week    Review of Systems Constitutional: No fever/chills Cardiovascular: Denies chest pain. Respiratory: Denies  shortness of breath. Gastrointestinal:  No nausea, no vomiting.   Skin: Healing laceration left thigh  Neurological: Negative for headaches, focal weakness or numbness.  10-point ROS otherwise negative.  ____________________________________________   PHYSICAL EXAM:  VITAL SIGNS: ED Triage Vitals  Enc Vitals Group     BP 06/09/15 1704 123/70 mmHg     Pulse Rate 06/09/15 1704 60     Resp 06/09/15 1704 16     Temp 06/09/15 1704 97.7 F (36.5 C)     Temp Source 06/09/15 1704 Oral     SpO2 06/09/15 1704 100 %     Weight 06/09/15 1704 110 lb (49.896 kg)     Height 06/09/15 1704  (1.702 m)     Head Cir --      Peak Flow --      Pain Score --      Pain Loc --      Pain Edu? --      Excl. in GC? --     Constitutional: Alert and oriented. Well appearing and in no acute distress. Eyes: Conjunctivae are normal. PERRL. EOMI. Head: Atraumatic. Nose: No congestion/rhinnorhea. Neck: No stridor.   Respiratory: Normal respiratory effort.  Gastrointestinal: Soft and nontender. No distention.  Musculoskeletal: Moves upper and lower extremities without difficulty. Neurologic:  Normal speech and language. No gross focal neurologic deficits are appreciated. No gait instability. Skin:  Skin is warm, dry and intact. No rash noted. Sutured area healed without any evidence of infection. Psychiatric: Mood and affect are normal. Speech and behavior are normal.  ____________________________________________   LABS (all labs ordered are listed, but only abnormal results are displayed)  Labs Reviewed - No data to display  PROCEDURES  Procedure(s) performed: None  Critical Care performed: No  ____________________________________________   INITIAL IMPRESSION / ASSESSMENT AND PLAN / ED COURSE  Pertinent labs & imaging results that were available during my care of the patient were reviewed by me and considered in my medical decision making (see chart for details).  Patient is  continue keeping clean and dry. Follow up with her PCP if any continued problems. ____________________________________________   FINAL CLINICAL IMPRESSION(S) / ED DIAGNOSES  Final diagnoses:  Encounter for removal of sutures      Tommi Rumps, PA-C 06/09/15 1719  Myrna Blazer, MD 06/09/15 2241

## 2015-06-09 NOTE — ED Notes (Signed)
Suture removal

## 2015-06-09 NOTE — Discharge Instructions (Signed)

## 2015-07-08 ENCOUNTER — Encounter: Payer: Self-pay | Admitting: Emergency Medicine

## 2015-07-08 ENCOUNTER — Emergency Department
Admission: EM | Admit: 2015-07-08 | Discharge: 2015-07-08 | Disposition: A | Discharge status: Home / Self Care | Payer: Medicaid Other | Attending: Emergency Medicine | Admitting: Emergency Medicine

## 2015-07-08 DIAGNOSIS — Z792 Long term (current) use of antibiotics: Secondary | ICD-10-CM | POA: Insufficient documentation

## 2015-07-08 DIAGNOSIS — R519 Headache, unspecified: Secondary | ICD-10-CM

## 2015-07-08 DIAGNOSIS — Z79899 Other long term (current) drug therapy: Secondary | ICD-10-CM | POA: Insufficient documentation

## 2015-07-08 DIAGNOSIS — J069 Acute upper respiratory infection, unspecified: Secondary | ICD-10-CM | POA: Insufficient documentation

## 2015-07-08 DIAGNOSIS — J988 Other specified respiratory disorders: Secondary | ICD-10-CM

## 2015-07-08 DIAGNOSIS — B9789 Other viral agents as the cause of diseases classified elsewhere: Secondary | ICD-10-CM

## 2015-07-08 DIAGNOSIS — R51 Headache: Secondary | ICD-10-CM

## 2015-07-08 MED ORDER — KETOROLAC TROMETHAMINE 30 MG/ML IJ SOLN
30.0000 mg | Freq: Once | INTRAMUSCULAR | Status: AC
  Administered 2015-07-08: 30 mg via INTRAMUSCULAR
  Filled 2015-07-08: qty 1

## 2015-07-08 NOTE — ED Notes (Signed)
Pt in with co headache since Saturday no hx of headaches.  States has had runny nose and sinus congestion with bodyaches.  No pain at this time, with no distress noted.

## 2015-07-08 NOTE — ED Notes (Signed)
Patient discharged to home per MD order. Patient in stable condition, and deemed medically cleared by ED provider for discharge. Discharge instructions reviewed with patient/family using "Teach Back"; verbalized understanding of medication education and administration, and information about follow-up care. Denies further concerns. ° °

## 2015-07-08 NOTE — Discharge Instructions (Signed)
Sinus Headache A sinus headache happens when your sinuses become clogged or swollen. You may feel pain or pressure in your face, forehead, ears, or upper teeth. Sinus headaches can be mild or severe. HOME CARE  Take medicines only as told by your doctor.  If you were given an antibiotic medicine, finish all of it even if you start to feel better.  Use a nose spray if you feel stuffed up (congested).  If told, apply a warm, moist washcloth to your face to help lessen pain. GET HELP IF:  You get headaches more than one time each week.  Light or sound bothers you.  You have a fever.  You feel sick to your stomach (nauseous) or you throw up (vomit).  Your headaches do not get better with treatment. GET HELP RIGHT AWAY IF:  You have trouble seeing.  You suddenly have very bad pain in your face or head.  You start to twitch or shake (seizure).  You are confused.  You have a stiff neck.   This information is not intended to replace advice given to you by your health care provider. Make sure you discuss any questions you have with your health care provider.   Document Released: 09/03/2010 Document Revised: 09/18/2014 Document Reviewed: 04/30/2014 Elsevier Interactive Patient Education 2016 [employer]  Viral Infections A virus is a type of germ. Viruses can cause:  Minor sore throats.  Aches and pains.  Headaches.  Runny nose.  Rashes.  Watery eyes.  Tiredness.  Coughs.  Loss of appetite.  Feeling sick to your stomach (nausea).  Throwing up (vomiting).  Watery poop (diarrhea). HOME CARE   Only take medicines as told by your doctor.  Drink enough water and fluids to keep your pee (urine) clear or pale yellow. Sports drinks are a good choice.  Get plenty of rest and eat healthy. Soups and broths with crackers or rice are fine. GET HELP RIGHT AWAY IF:   You have a very bad headache.  You have shortness of breath.  You have chest pain or neck  pain.  You have an unusual rash.  You cannot stop throwing up.  You have watery poop that does not stop.  You cannot keep fluids down.  You or your child has a temperature by mouth above 102 F (38.9 C), not controlled by medicine.  Your baby is older than 3 months with a rectal temperature of 102 F (38.9 C) or higher.  Your baby is 29 months old or younger with a rectal temperature of 100.4 F (38 C) or higher. MAKE SURE YOU:   Understand these instructions.  Will watch this condition.  Will get help right away if you are not doing well or get worse.   This information is not intended to replace advice given to you by your health care provider. Make sure you discuss any questions you have with your health care provider.   Document Released: 04/16/2008 Document Revised: 07/27/2011 Document Reviewed: 10/10/2014 Elsevier Interactive Patient Education [employer]

## 2015-07-08 NOTE — ED Provider Notes (Signed)
[facility] Emergency Department Provider Note  ____________________________________________  Time seen: Approximately 10:16 PM  I have reviewed the triage vital signs and the nursing notes.   HISTORY  Chief Complaint Headache    HPI Becky Park is a 20 y.o. female , NAD, presents to the emergency department with frontal headache for 4 days. States it is given off and on. It feels like pressure. Denies photophobia or phonophobia. Denies changes in vision. Has not had any dizziness or thunderclap type headaches. Headache is not working from sleep. This is not the worst headache of her life. She's had off-and-on runny nose and sinus congestion over the last few days but feels those symptoms are resolving.   Past Medical History  Diagnosis Date  . Anxiety   . GERD (gastroesophageal reflux disease)     There are no active problems to display for this patient.   History reviewed. No pertinent past surgical history.  Current Outpatient Rx  Name  Route  Sig  Dispense  Refill  . azithromycin (ZITHROMAX Z-PAK) 250 MG tablet      Take 2 tablets (500 mg) on  Day 1,  followed by 1 tablet (250 mg) once daily on Days 2 through 5.   6 each   0   . famotidine (PEPCID) 40 MG tablet   Oral   Take 1 tablet (40 mg total) by mouth every evening.   30 tablet   1   . fluconazole (DIFLUCAN) 150 MG tablet   Oral   Take 1 tablet (150 mg total) by mouth once.   1 tablet   1   . ibuprofen (ADVIL,MOTRIN) 800 MG tablet   Oral   Take 1 tablet (800 mg total) by mouth every 8 (eight) hours as needed.   15 tablet   0   . ketoconazole (NIZORAL) 2 % cream   Topical   Apply 1 application topically 2 (two) times daily.   30 g   0   . ranitidine (ZANTAC) 150 MG tablet   Oral   Take 1 tablet (150 mg total) by mouth at bedtime.   30 tablet   1     Allergies Review of patient's allergies indicates no known allergies.  No family history on file.  Social  History Social History  Substance Use Topics  . Smoking status: Never Smoker   . Smokeless tobacco: None  . Alcohol Use: 2.4 oz/week    4 Glasses of wine per week     Review of Systems  Constitutional: No fever/chills, fatigue Eyes: No visual changes. No discharge ENT: Nasal congestion, runny nose (resolved). No sore throat, ear pain. Cardiovascular: No chest pain. Respiratory: No cough. No shortness of breath. No wheezing.  Gastrointestinal: No abdominal pain.  No nausea, vomiting.   Musculoskeletal: Negative for myalgias.  Skin: Negative for rash. Neurological: Positive for headaches, but no focal weakness or numbness. 10-point ROS otherwise negative.  ____________________________________________   PHYSICAL EXAM:  VITAL SIGNS: ED Triage Vitals  Enc Vitals Group     BP 07/08/15 2041 128/77 mmHg     Pulse Rate 07/08/15 2041 61     Resp 07/08/15 2041 20     Temp 07/08/15 2041 98.3 F (36.8 C)     Temp Source 07/08/15 2041 Oral     SpO2 07/08/15 2041 100 %     Weight 07/08/15 2041 100 lb (45.36 kg)     Height 07/08/15 2041  (1.702 m)     Head  Cir --      Peak Flow --      Pain Score 07/08/15 2043 1     Pain Loc --      Pain Edu? --      Excl. in GC? --     Constitutional: Alert and oriented. Well appearing and in no acute distress. Eyes: Conjunctivae are normal. PERRL. EOMI without pain.  Head: Atraumatic. ENT:  Frontal sinus tenderness with palpation.       Ears: TMs visualized bilaterally without abnormality.      Nose: Mild congestion with trace clear rhinnorhea.      Mouth/Throat: Mucous membranes are moist. Pharynx without erythema, swelling, exudate area Neck: Supple with full range of motion. Hematological/Lymphatic/Immunilogical: No cervical lymphadenopathy. Cardiovascular: Normal rate, regular rhythm. Normal S1 and S2.  Good peripheral circulation. Respiratory: Normal respiratory effort without tachypnea or retractions. Lungs  CTAB. Gastrointestinal: Soft and nontender. No distention. No CVA tenderness. Musculoskeletal: No lower extremity tenderness nor edema.  No joint effusions. Neurologic:  Normal speech and language. No gross focal neurologic deficits are appreciated.  Skin:  Skin is warm, dry and intact. No rash noted. Psychiatric: Mood and affect are normal. Speech and behavior are normal. Patient exhibits appropriate insight and judgement.   ____________________________________________   LABS  None  ____________________________________________  EKG  None ____________________________________________  RADIOLOGY  None ____________________________________________    PROCEDURES  Procedure(s) performed: None    Medications  ketorolac (TORADOL) 30 MG/ML injection 30 mg (30 mg Intramuscular Given 07/08/15 2301)     ____________________________________________   INITIAL IMPRESSION / ASSESSMENT AND PLAN / ED COURSE  Patient's diagnosis is consistent with sinus headache due to viral infection. Patient will be discharged home with instructions for home care to include OTC cold medications and may take Tylenol as needed for pain. Patient is to follow up with [facility] if symptoms persist past this treatment course. Patient is given ED precautions to return to the ED for any worsening or new symptoms.   ____________________________________________  FINAL CLINICAL IMPRESSION(S) / ED DIAGNOSES  Final diagnoses:  Sinus headache  Viral respiratory infection      NEW MEDICATIONS STARTED DURING THIS VISIT:  New Prescriptions   No medications on file         Hope Pigeon, PA-C 07/08/15 2314  Sharyn Creamer, MD 07/09/15 0000

## 2015-07-08 NOTE — ED Notes (Signed)
Patient ambulatory to triage with steady gait, without difficulty or distress noted; pt reports frontal HA x 4 days; st noted blood in mucous when blowing sinuses on Saturday

## 2015-08-19 DIAGNOSIS — Z62810 Personal history of physical and sexual abuse in childhood: Secondary | ICD-10-CM | POA: Insufficient documentation

## 2015-08-22 LAB — HM PAP SMEAR: HM Pap smear: NEGATIVE

## 2015-09-29 IMAGING — CR DG CHEST 2V
1 series · 2 of 2 positions shown · non-contrast
Comparison: None.

CLINICAL DATA: Intermittent chest pain for weeks. Recent arm
tingling. Sternal chest burning and squeezing sensation.

EXAM:
CHEST  2 VIEW

[Series 1: dg chest 2 view · 0.14mm/px · 2 of 2 slices shown]
[im 1/2]
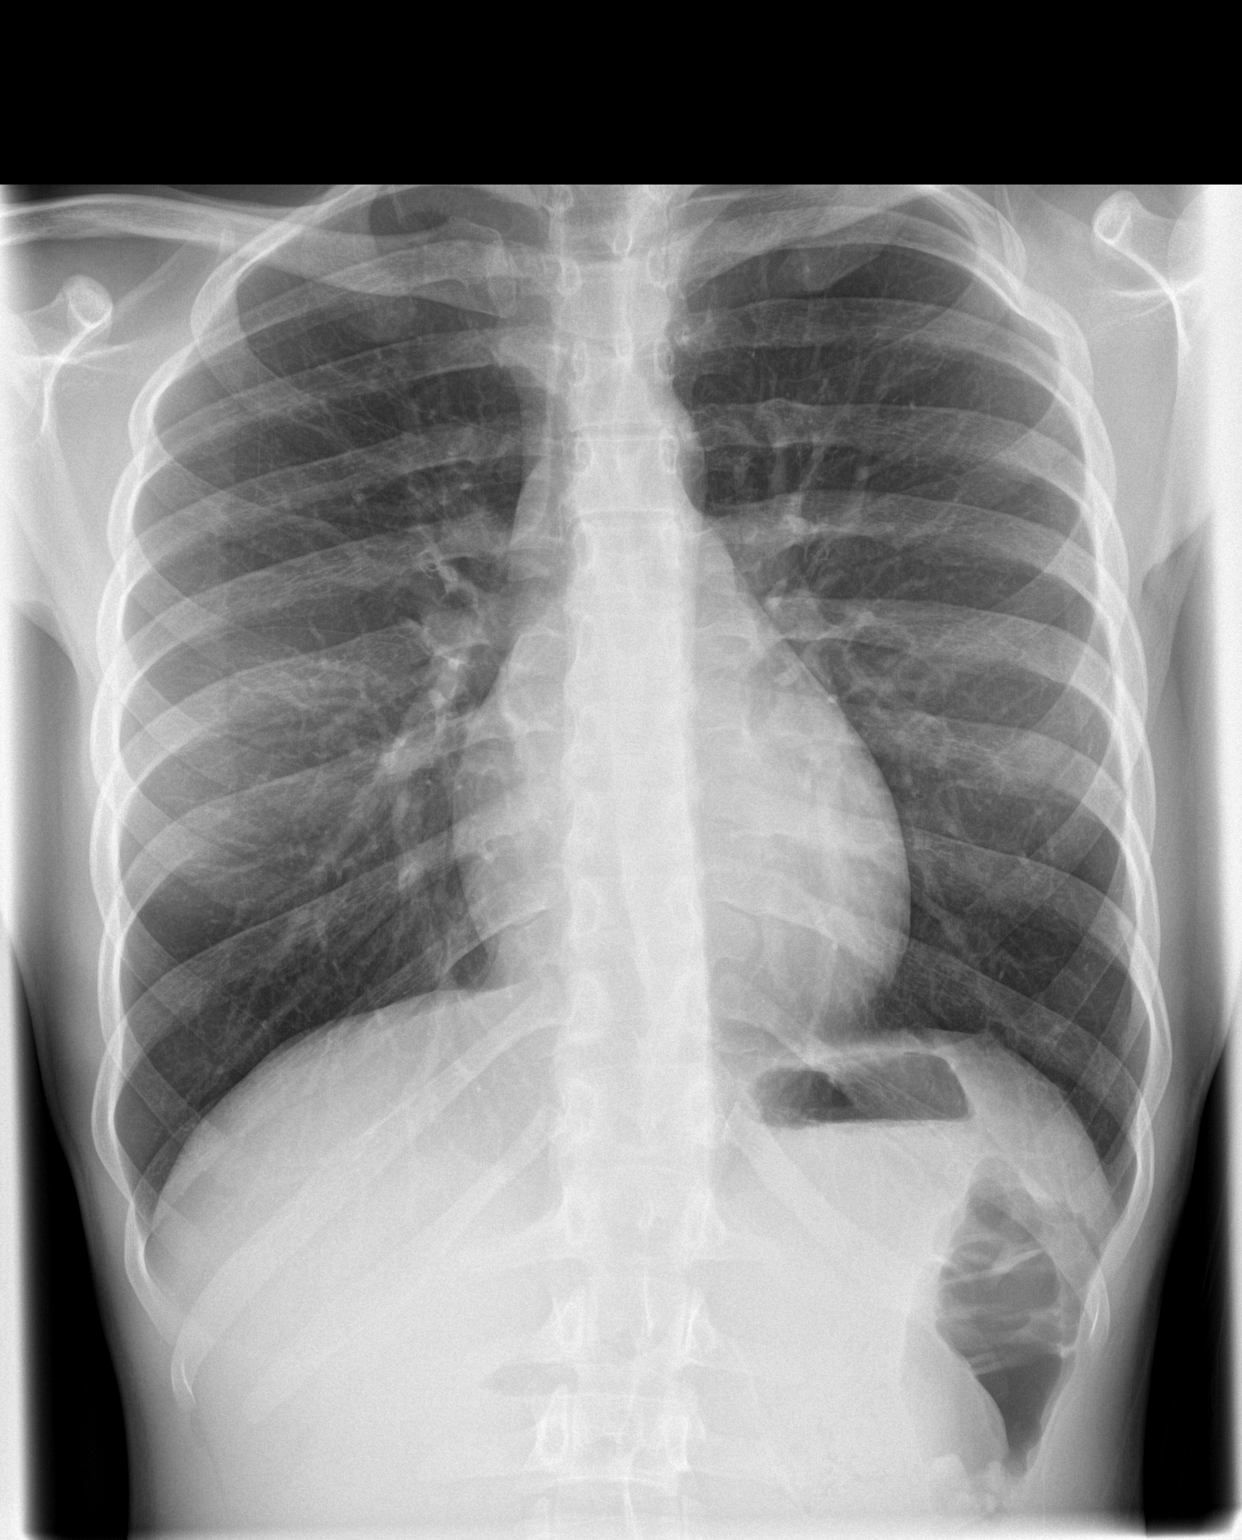
[im 2/2]
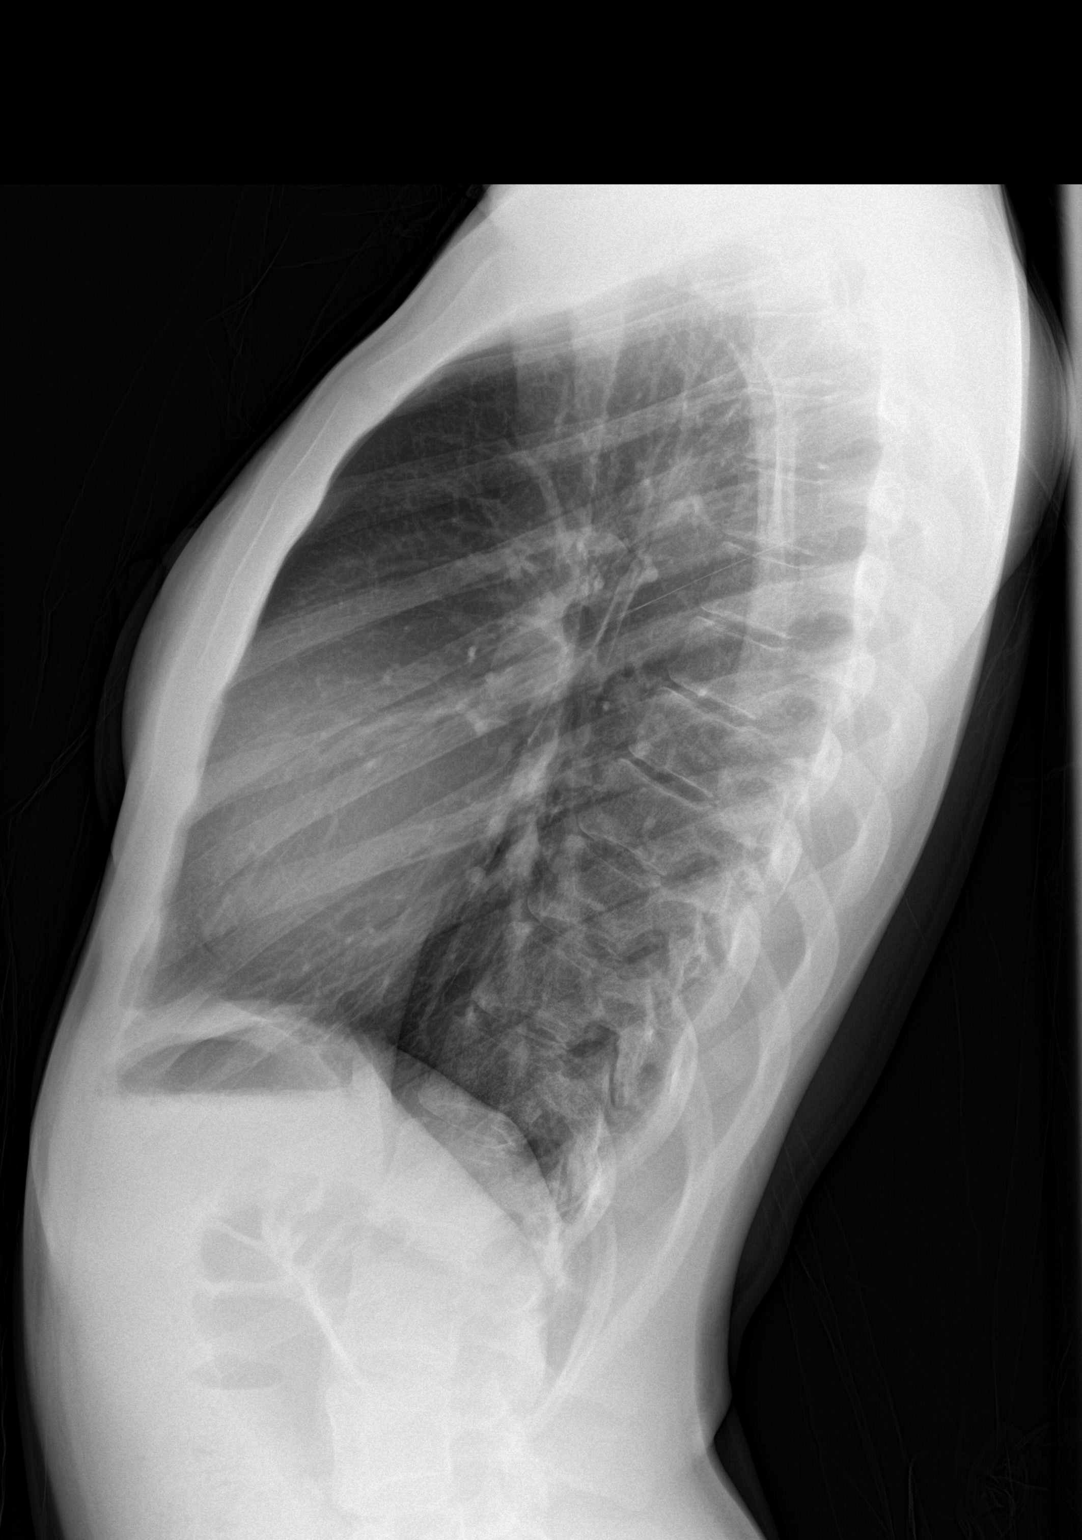

[2 of 2 positions shown; findings below may reference images not displayed]

FINDINGS: Left posterolateral third rib deformity likely from an old healed
fracture.

The lungs appear clear. Cardiac and mediastinal contours normal. No
pleural effusion identified.
IMPRESSION: 1. No acute findings to correlate with the patient's symptoms.

## 2015-09-30 ENCOUNTER — Emergency Department
Admission: EM | Admit: 2015-09-30 | Discharge: 2015-09-30 | Disposition: A | Discharge status: Home / Self Care | Payer: Medicaid Other | Attending: Emergency Medicine | Admitting: Emergency Medicine

## 2015-09-30 ENCOUNTER — Encounter: Payer: Self-pay | Admitting: Emergency Medicine

## 2015-09-30 DIAGNOSIS — J029 Acute pharyngitis, unspecified: Secondary | ICD-10-CM

## 2015-09-30 LAB — POCT RAPID STREP A: STREPTOCOCCUS, GROUP A SCREEN (DIRECT): NEGATIVE

## 2015-09-30 MED ORDER — IBUPROFEN 600 MG PO TABS: 600.0000 mg | ORAL_TABLET | Freq: Four times a day (QID) | ORAL | Status: DC | PRN

## 2015-09-30 MED ORDER — CETIRIZINE HCL 10 MG PO CAPS: 10.0000 mg | ORAL_CAPSULE | Freq: Every day | ORAL | Status: DC

## 2015-09-30 NOTE — ED Provider Notes (Signed)
[facility] Emergency Department Provider Note  ____________________________________________  Time seen: Approximately 6:47 PM  I have reviewed the triage vital signs and the nursing notes.   HISTORY  Chief Complaint Sore Throat   HPI Becky Park is a 20 y.o. female who presents to the emergency department for evaluation of sore throat. She states that she noticed white bumps on the throat a couple of days ago. She denies fever or cough. She denies headache. She denies nausea, vomiting, or diarrhea.   Past Medical History  Diagnosis Date  . Anxiety   . GERD (gastroesophageal reflux disease)     There are no active problems to display for this patient.   History reviewed. No pertinent past surgical history.  Current Outpatient Rx  Name  Route  Sig  Dispense  Refill  . azithromycin (ZITHROMAX Z-PAK) 250 MG tablet      Take 2 tablets (500 mg) on  Day 1,  followed by 1 tablet (250 mg) once daily on Days 2 through 5.   6 each   0   . Cetirizine HCl 10 MG CAPS   Oral   Take 1 capsule (10 mg total) by mouth daily.   30 capsule   3   . EXPIRED: famotidine (PEPCID) 40 MG tablet   Oral   Take 1 tablet (40 mg total) by mouth every evening.   30 tablet   1   . fluconazole (DIFLUCAN) 150 MG tablet   Oral   Take 1 tablet (150 mg total) by mouth once.   1 tablet   1   . ibuprofen (ADVIL,MOTRIN) 600 MG tablet   Oral   Take 1 tablet (600 mg total) by mouth every 6 (six) hours as needed.   30 tablet   0   . ketoconazole (NIZORAL) 2 % cream   Topical   Apply 1 application topically 2 (two) times daily.   30 g   0   . ranitidine (ZANTAC) 150 MG tablet   Oral   Take 1 tablet (150 mg total) by mouth at bedtime.   30 tablet   1     Allergies Review of patient's allergies indicates no known allergies.  No family history on file.  Social History Social History  Substance Use Topics  . Smoking status: Never Smoker   . Smokeless  tobacco: None  . Alcohol Use: 2.4 oz/week    4 Glasses of wine per week    Review of Systems Constitutional: Negative for fever. Eyes: No visual changes. ENT: Positive for sore throat; Negative for difficulty swallowing. Respiratory: Denies shortness of breath. Gastrointestinal: No abdominal pain.  No nausea, no vomiting.  No diarrhea.  Musculoskeletal: negative for generalized body aches. Skin: Negative for rash. Neurological: Negative for headaches, focal weakness or numbness.  ____________________________________________   PHYSICAL EXAM:  VITAL SIGNS: ED Triage Vitals  Enc Vitals Group     BP 09/30/15 1731 112/56 mmHg     Pulse Rate 09/30/15 1731 55     Resp 09/30/15 1731 15     Temp 09/30/15 1731 98.2 F (36.8 C)     Temp src --      SpO2 09/30/15 1731 98 %     Weight 09/30/15 1731 112 lb 8 oz (51.03 kg)     Height 09/30/15 1731 5\' 7"  (1.702 m)     Head Cir --      Peak Flow --      Pain Score 09/30/15 1740 0  Pain Loc --      Pain Edu? --      Excl. in GC? --    Constitutional: Alert and oriented. Well appearing and in no acute distress. Eyes: Conjunctivae are normal. PERRL. EOMI. Head: Atraumatic. Nose: No congestion/rhinnorhea. Mouth/Throat: Mucous membranes are moist.  Oropharynx with cobblestone appearing drainage, no exudate. Neck: No stridor.  Lymphatic: Lymphadenopathy: No Cardiovascular: Normal rate, regular rhythm. Good peripheral circulation. Respiratory: Normal respiratory effort. Lungs CTAB. Gastrointestinal: Soft and nontender. Musculoskeletal: No lower extremity tenderness nor edema.   Neurologic:  Normal speech and language. No gross focal neurologic deficits are appreciated. Speech is normal. No gait instability. Skin:  Skin is warm, dry and intact. No rash noted Psychiatric: Mood and affect are normal. Speech and behavior are normal.  ____________________________________________   LABS (all labs ordered are listed, but only abnormal  results are displayed)  Labs Reviewed  CULTURE, GROUP A STREP [facility])  POCT RAPID STREP A   ____________________________________________  EKG   ____________________________________________  RADIOLOGY   ____________________________________________   PROCEDURES  Procedure(s) performed: None  Critical Care performed: No  ____________________________________________   INITIAL IMPRESSION / ASSESSMENT AND PLAN / ED COURSE  Pertinent labs & imaging results that were available during my care of the patient were reviewed by me and considered in my medical decision making (see chart for details).  Negative rapid strep and low clinical suspicion. She will receive prescriptions for cetirizine and ibuprofen. She is to follow up with the PCP of her choice for symptoms that are not improving over the next few days or return to the ER for symptoms that change or worsen. ____________________________________________   FINAL CLINICAL IMPRESSION(S) / ED DIAGNOSES  Final diagnoses:  Pharyngitis      Chinita Pester, FNP 09/30/15 1901  Jene Every, MD 10/03/15 [phone]

## 2015-09-30 NOTE — ED Notes (Signed)
Pt reports sore throat since Saturday, reports "bumps" to throat. Pt airway intact, no respiratory distress or difficulty speaking.

## 2015-09-30 NOTE — Discharge Instructions (Signed)
Pharyngitis °Pharyngitis is redness, pain, and swelling (inflammation) of your pharynx.  °CAUSES  °Pharyngitis is usually caused by infection. Most of the time, these infections are from viruses (viral) and are part of a cold. However, sometimes pharyngitis is caused by bacteria (bacterial). Pharyngitis can also be caused by allergies. Viral pharyngitis may be spread from person to person by coughing, sneezing, and personal items or utensils (cups, forks, spoons, toothbrushes). Bacterial pharyngitis may be spread from person to person by more intimate contact, such as kissing.  °SIGNS AND SYMPTOMS  °Symptoms of pharyngitis include:   °· Sore throat.   °· Tiredness (fatigue).   °· Low-grade fever.   °· Headache. °· Joint pain and muscle aches. °· Skin rashes. °· Swollen lymph nodes. °· Plaque-like film on throat or tonsils (often seen with bacterial pharyngitis). °DIAGNOSIS  °Your health care provider will ask you questions about your illness and your symptoms. Your medical history, along with a physical exam, is often all that is needed to diagnose pharyngitis. Sometimes, a rapid strep test is done. Other lab tests may also be done, depending on the suspected cause.  °TREATMENT  °Viral pharyngitis will usually get better in 3-4 days without the use of medicine. Bacterial pharyngitis is treated with medicines that kill germs (antibiotics).  °HOME CARE INSTRUCTIONS  °· Drink enough water and fluids to keep your urine clear or pale yellow.   °· Only take over-the-counter or prescription medicines as directed by your health care provider:   °· If you are prescribed antibiotics, make sure you finish them even if you start to feel better.   °· Do not take aspirin.   °· Get lots of rest.   °· Gargle with 8 oz of salt water (½ tsp of salt per 1 qt of water) as often as every 1-2 hours to soothe your throat.   °· Throat lozenges (if you are not at risk for choking) or sprays may be used to soothe your throat. °SEEK MEDICAL  CARE IF:  °· You have large, tender lumps in your neck. °· You have a rash. °· You cough up green, yellow-brown, or bloody spit. °SEEK IMMEDIATE MEDICAL CARE IF:  °· Your neck becomes stiff. °· You drool or are unable to swallow liquids. °· You vomit or are unable to keep medicines or liquids down. °· You have severe pain that does not go away with the use of recommended medicines. °· You have trouble breathing (not caused by a stuffy nose). °MAKE SURE YOU:  °· Understand these instructions. °· Will watch your condition. °· Will get help right away if you are not doing well or get worse. °  °This information is not intended to replace advice given to you by your health care provider. Make sure you discuss any questions you have with your health care provider. °  °Document Released: 05/04/2005 Document Revised: 02/22/2013 Document Reviewed: 01/09/2013 °Elsevier Interactive Patient Education ©2016 [employer] Inc[employer] ° °Sore Throat °A sore throat is a painful, burning, sore, or scratchy feeling of the throat. There may be pain or tenderness when swallowing or talking. You may have other symptoms with a sore throat. These include coughing, sneezing, fever, or a swollen neck. A sore throat is often the first sign of another sickness. These sicknesses may include a cold, flu, strep throat, or an infection called mono. Most sore throats go away without medical treatment.  °HOME CARE  °· Only take medicine as told by your doctor. °· Drink enough fluids to keep your pee (urine) clear or pale yellow. °·   Rest as needed. °· Try using throat sprays, lozenges, or suck on hard candy (if older than 4 years or as told). °· Sip warm liquids, such as broth, herbal tea, or warm water with honey. Try sucking on frozen ice pops or drinking cold liquids. °· Rinse the mouth (gargle) with salt water. Mix 1 teaspoon salt with 8 ounces of water. °· Do not smoke. Avoid being around others when they are smoking. °· Put a humidifier in your bedroom  at night to moisten the air. You can also turn on a hot shower and sit in the bathroom for 5-10 minutes. Be sure the bathroom door is closed. °GET HELP RIGHT AWAY IF:  °· You have trouble breathing. °· You cannot swallow fluids, soft foods, or your spit (saliva). °· You have more puffiness (swelling) in the throat. °· Your sore throat does not get better in 7 days. °· You feel sick to your stomach (nauseous) and throw up (vomit). °· You have a fever or lasting symptoms for more than 2-3 days. °· You have a fever and your symptoms suddenly get worse. °MAKE SURE YOU:  °· Understand these instructions. °· Will watch your condition. °· Will get help right away if you are not doing well or get worse. °  °This information is not intended to replace advice given to you by your health care provider. Make sure you discuss any questions you have with your health care provider. °  °Document Released: 02/11/2008 Document Revised: 01/27/2012 Document Reviewed: 01/10/2012 °Elsevier Interactive Patient Education ©2016 [employer] Inc. ° °

## 2015-09-30 NOTE — ED Notes (Signed)
Sore throat for couple of days   Noticed white "bumps" to back of throat

## 2015-10-02 LAB — CULTURE, GROUP A STREP (THRC)

## 2015-10-22 IMAGING — US US ABDOMEN LIMITED
1 series · 14 of 25 positions shown · non-contrast
Comparison: None.

CLINICAL DATA: Right upper quadrant pain

EXAM:
US ABDOMEN LIMITED - RIGHT UPPER QUADRANT

[Series 1: us abdomen limited · 0.18mm/px · 14 of 36 slices shown]
[im 1/36]
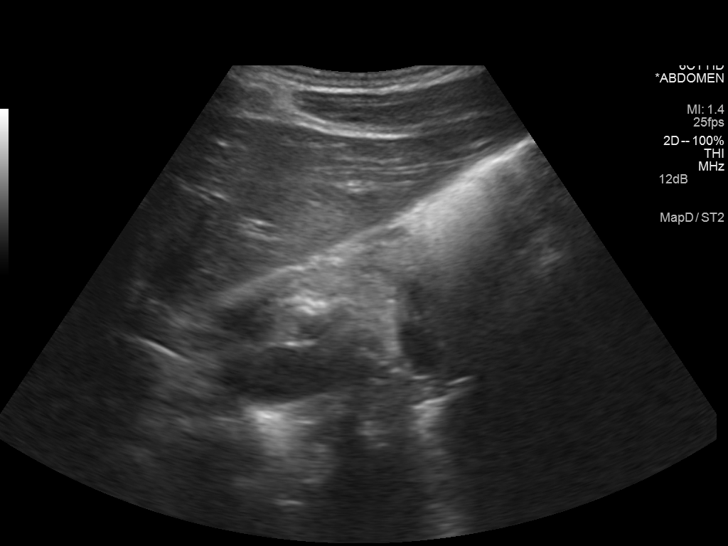
[im 3/36]
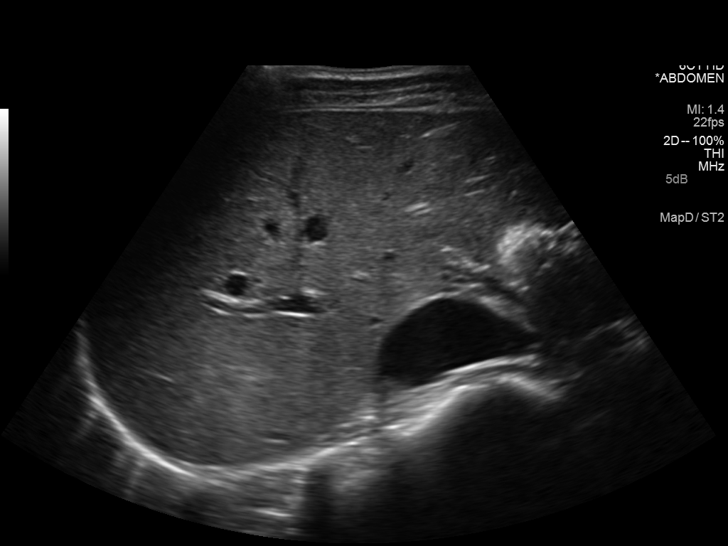
[im 6/36]
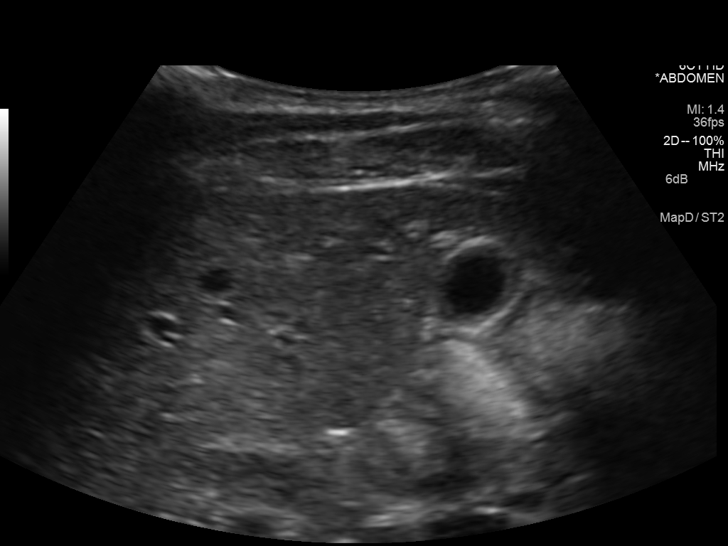
[im 9/36]
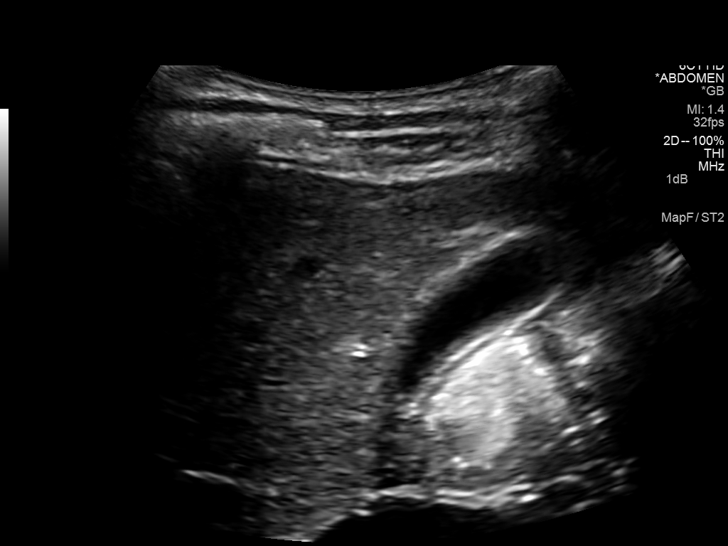
[im 12/36]
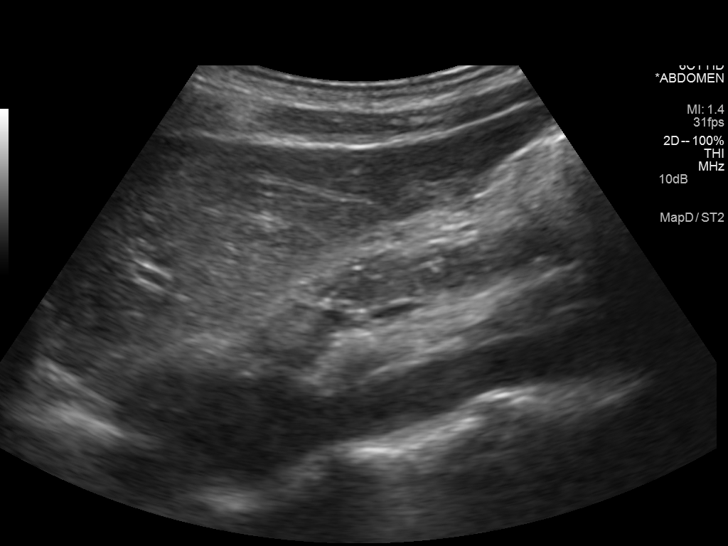
[im 14/36]
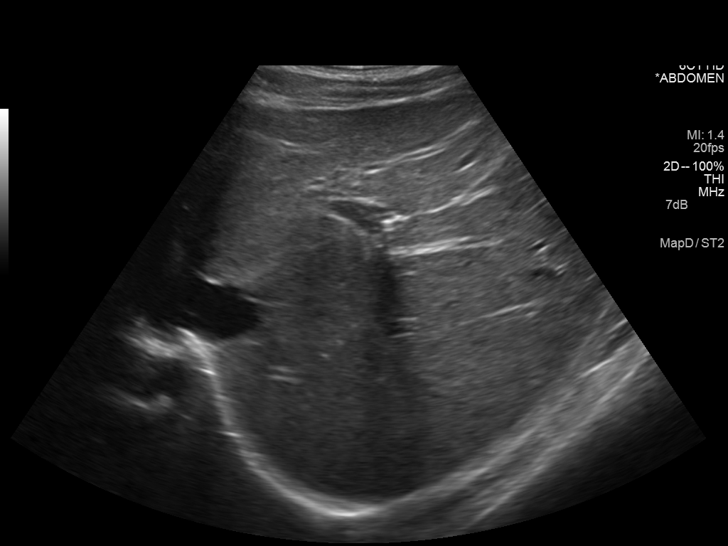
[im 17/36]
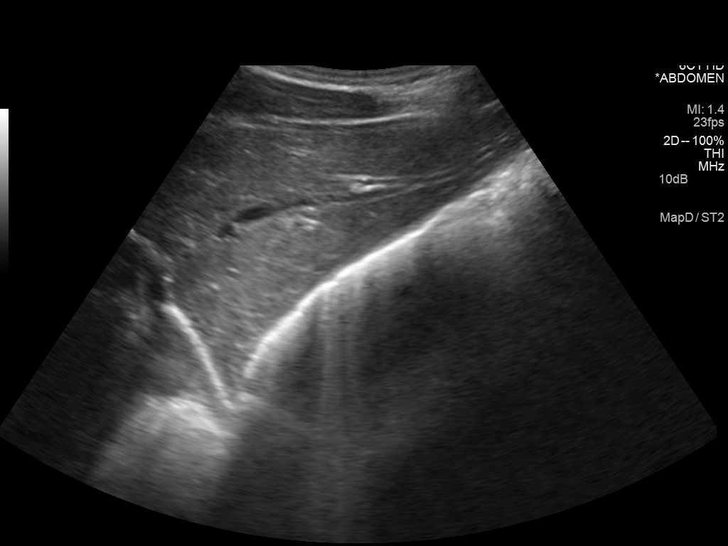
[im 19/36]
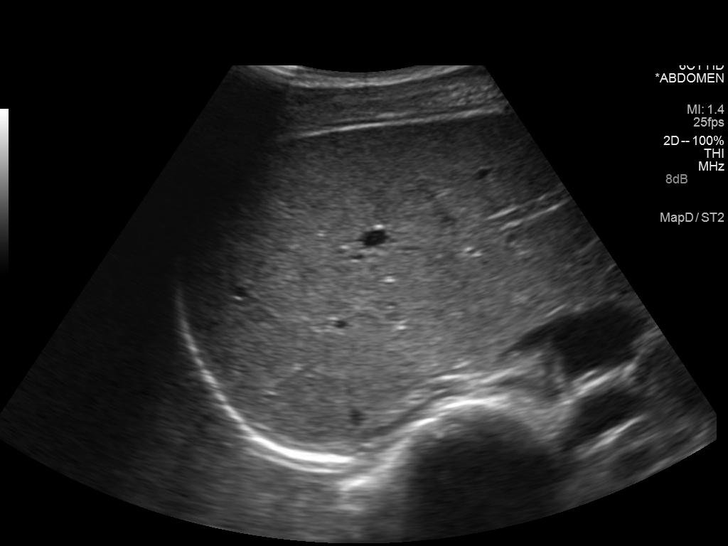
[im 22/36]
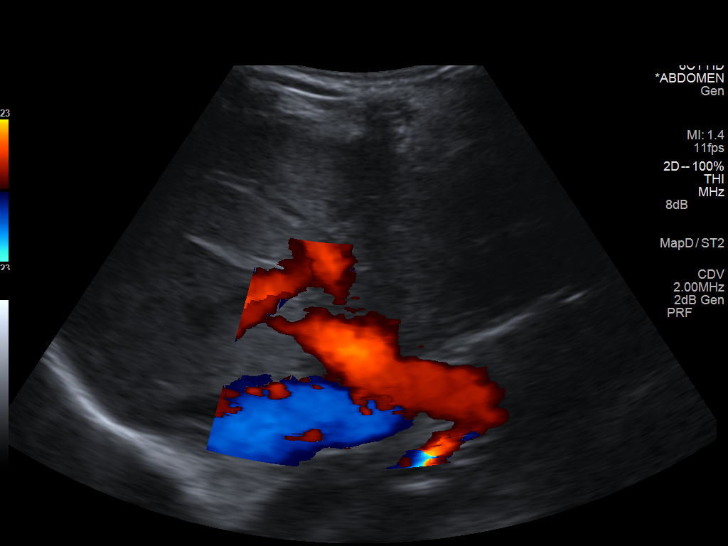
[im 24/36]
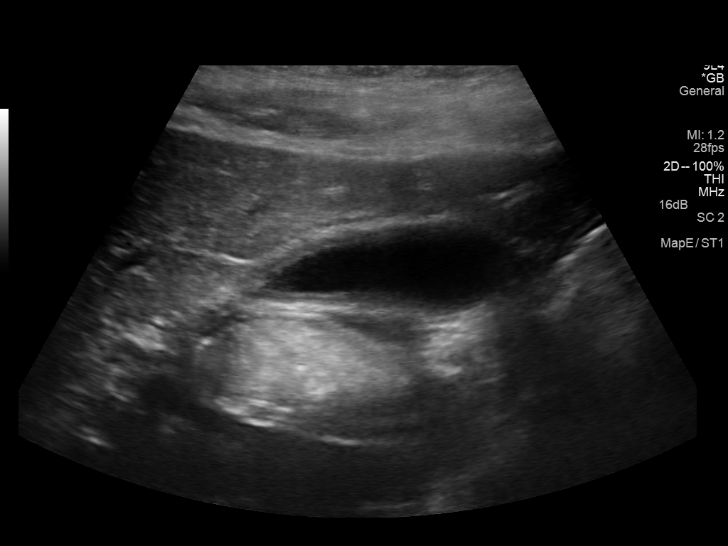
[im 27/36]
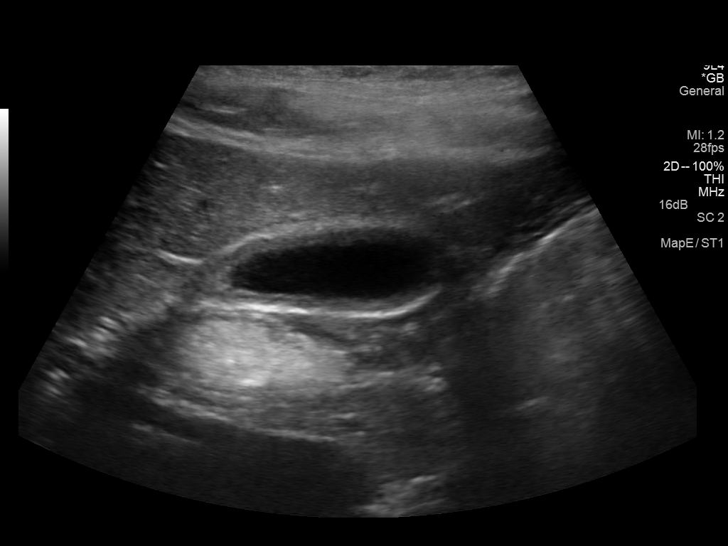
[im 30/36]
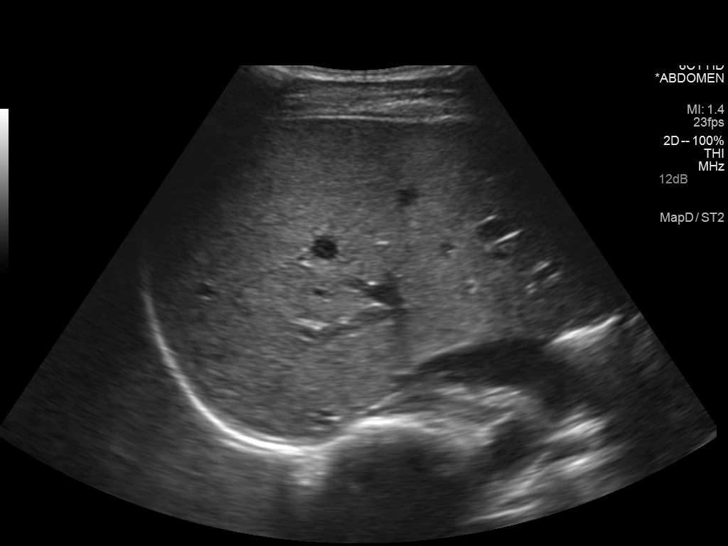
[im 33/36]
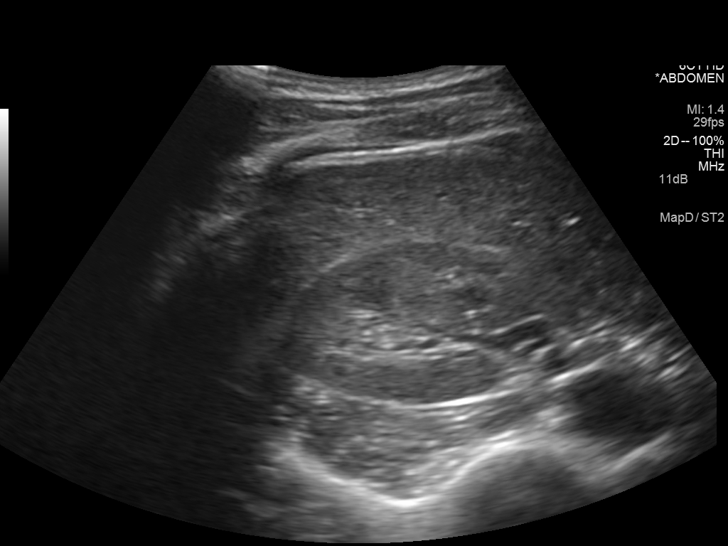
[im 36/36]
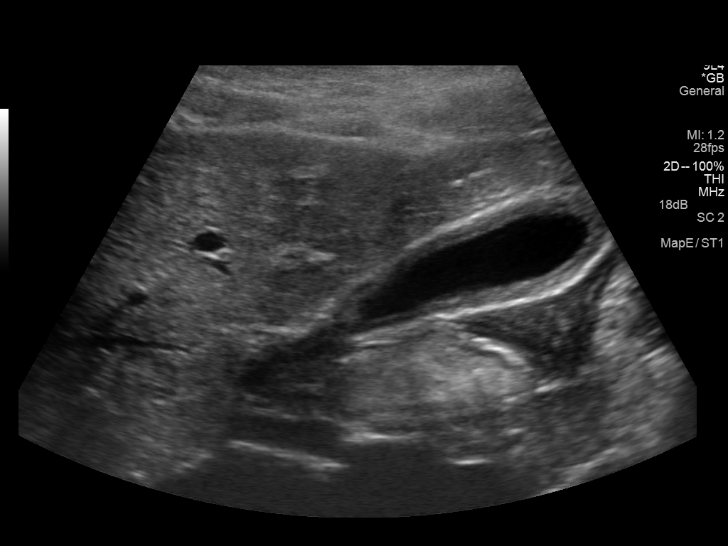

[14 of 25 positions shown; findings below may reference images not displayed]

FINDINGS: Gallbladder:

Contracted gallbladder without evidence of gallstones. No thickening
of gallbladder wall. No sonographic Murphy's sign.

Common bile duct:

Diameter: 2.2 mm in diameter within normal limits.

Liver:

No focal lesion identified. Within normal limits in parenchymal
echogenicity.
IMPRESSION: Unremarkable right upper quadrant ultrasound.

## 2016-05-04 IMAGING — CR DG ANKLE COMPLETE 3+V*L*
1 series · 3 of 3 positions shown · non-contrast
Comparison: None.

CLINICAL DATA: Left ankle injury 7 months ago. Persistent worsening
ankle pain. Initial encounter.

EXAM:
LEFT ANKLE COMPLETE - 3+ VIEW

[Series 1: x ankle ap left · 0.14mm/px · 3 of 3 slices shown]
[im 1/3]
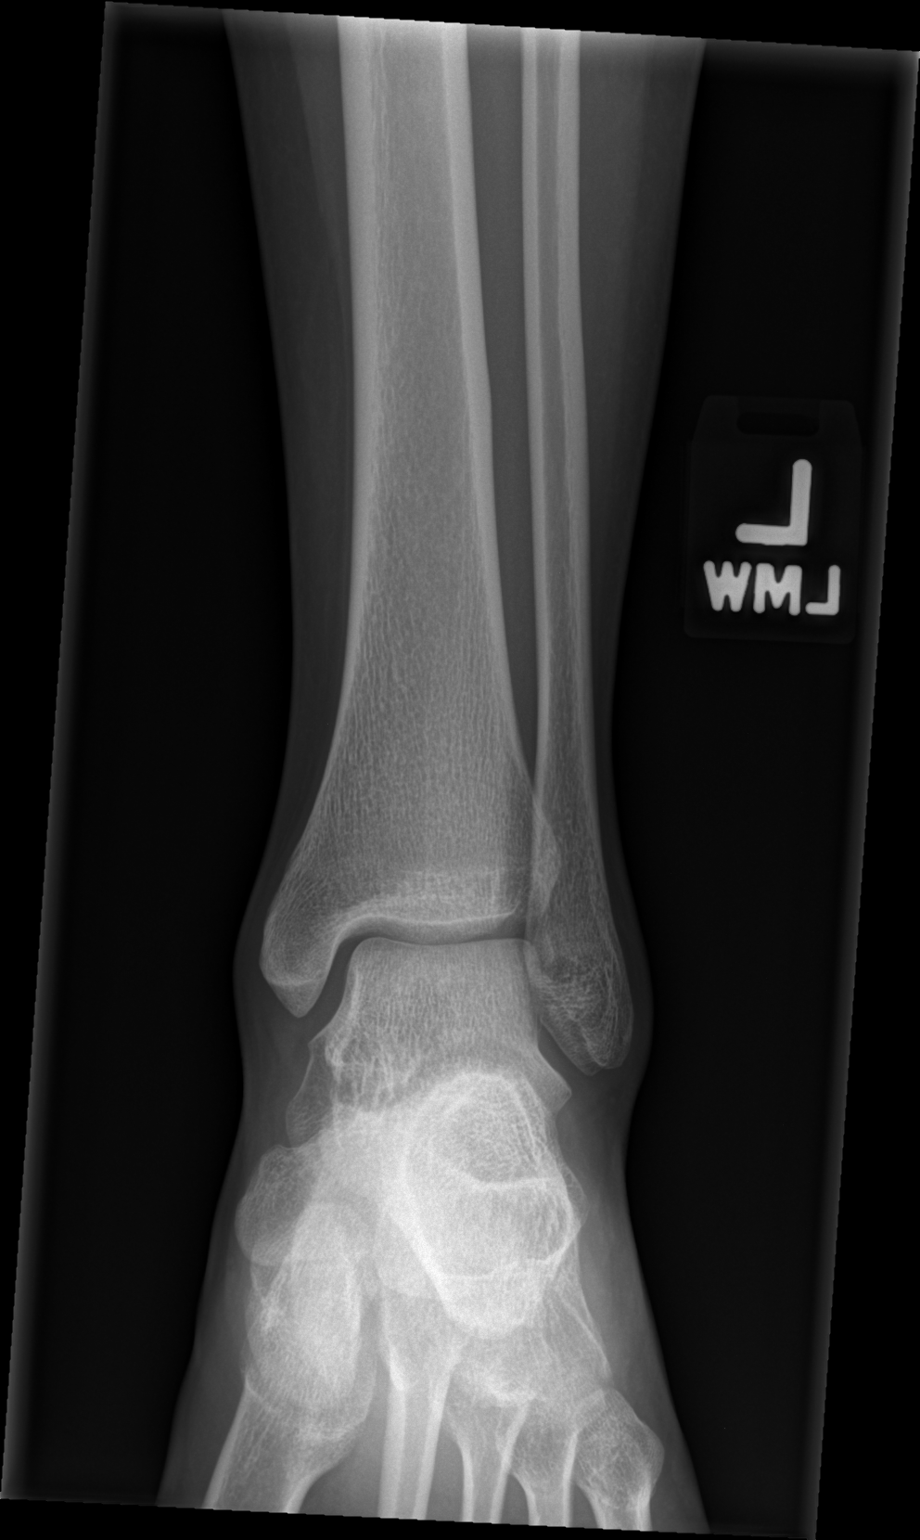
[im 2/3]
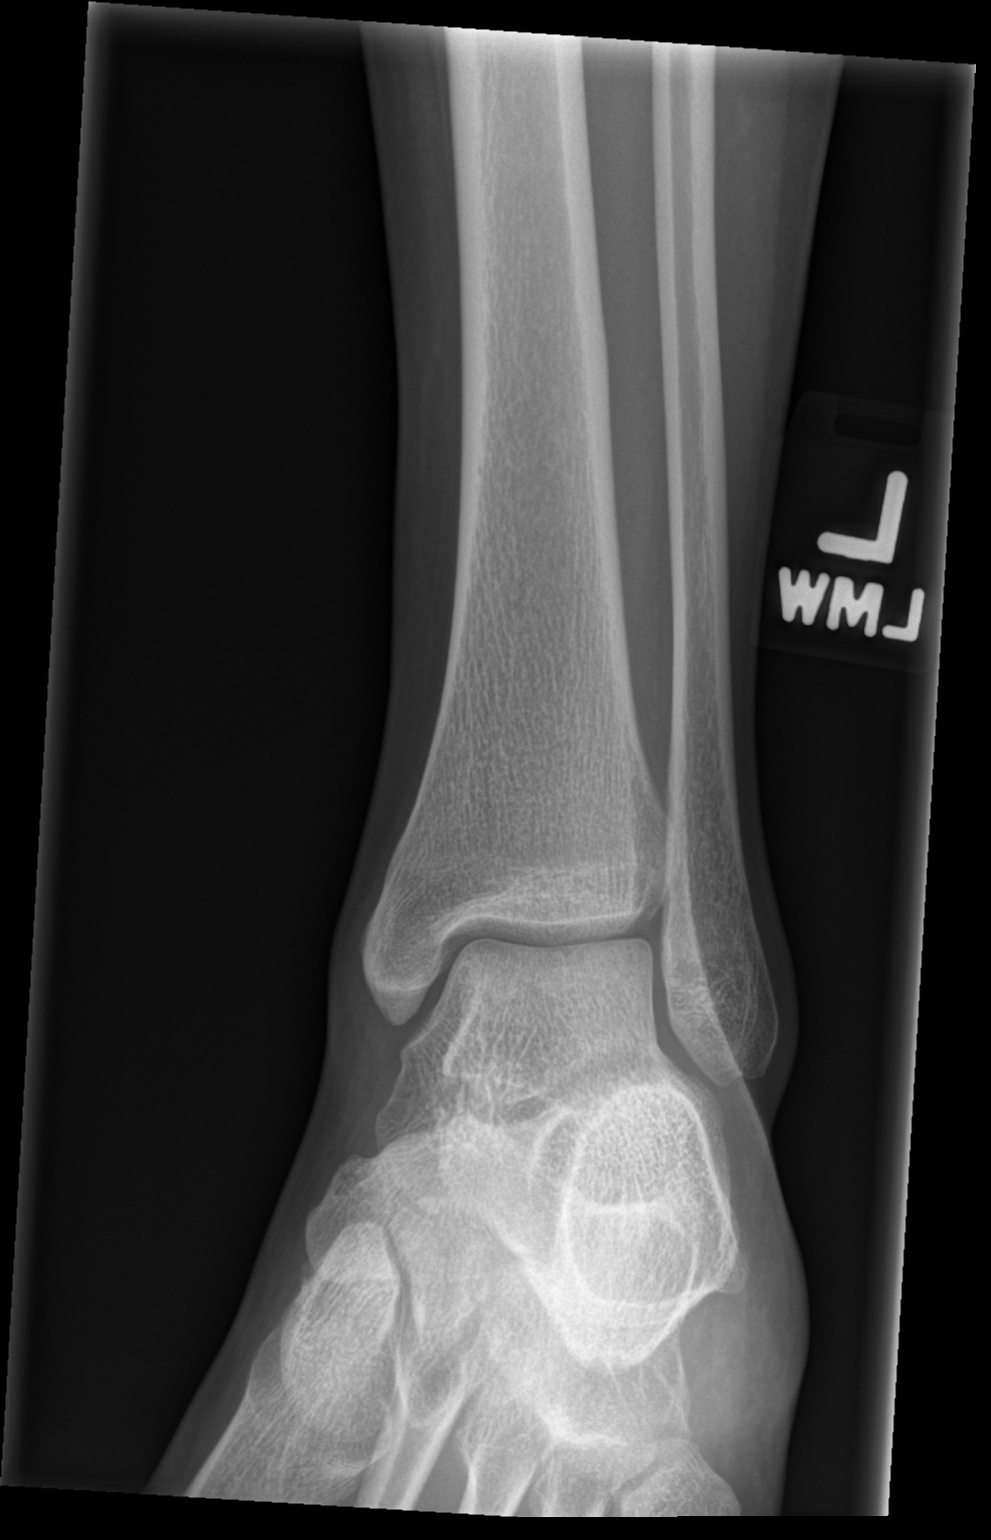
[im 3/3]
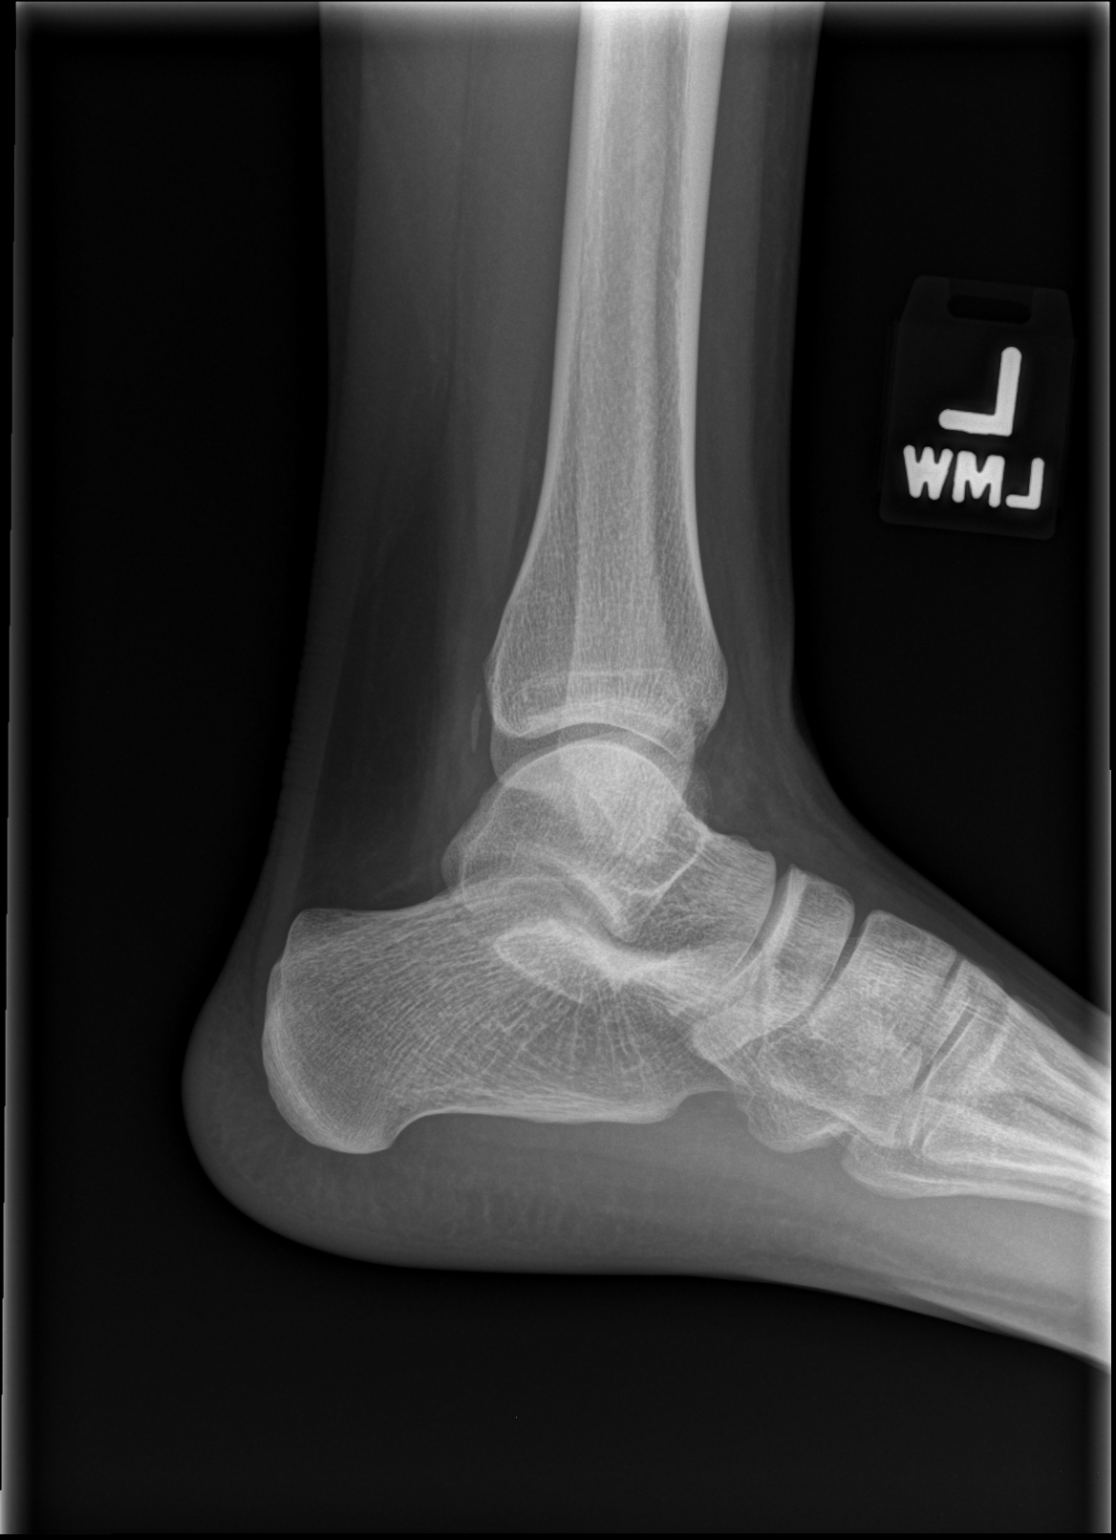

[3 of 3 positions shown; findings below may reference images not displayed]

FINDINGS: There is no evidence of acute fracture, dislocation, or joint
effusion.

A linear soft tissue calcification is seen along the posterior
aspect of the ankle joint on the lateral projection, likely due to
old trauma. No evidence of ankle joint arthropathy or other bone
lesions.
IMPRESSION: No acute findings.

## 2016-11-09 DIAGNOSIS — F411 Generalized anxiety disorder: Secondary | ICD-10-CM

## 2016-11-09 DIAGNOSIS — F331 Major depressive disorder, recurrent, moderate: Secondary | ICD-10-CM | POA: Insufficient documentation

## 2016-11-09 DIAGNOSIS — F410 Panic disorder [episodic paroxysmal anxiety] without agoraphobia: Secondary | ICD-10-CM | POA: Insufficient documentation

## 2016-11-10 DIAGNOSIS — F422 Mixed obsessional thoughts and acts: Secondary | ICD-10-CM | POA: Insufficient documentation

## 2017-06-15 LAB — HM HIV SCREENING LAB: HM HIV Screening: NEGATIVE

## 2017-10-20 ENCOUNTER — Emergency Department
Admission: EM | Admit: 2017-10-20 | Discharge: 2017-10-20 | Disposition: A | Discharge status: Home / Self Care | Payer: Self-pay | Attending: Emergency Medicine | Admitting: Emergency Medicine

## 2017-10-20 ENCOUNTER — Other Ambulatory Visit: Payer: Self-pay

## 2017-10-20 DIAGNOSIS — Z79899 Other long term (current) drug therapy: Secondary | ICD-10-CM | POA: Insufficient documentation

## 2017-10-20 DIAGNOSIS — R109 Unspecified abdominal pain: Secondary | ICD-10-CM | POA: Insufficient documentation

## 2017-10-20 DIAGNOSIS — R110 Nausea: Secondary | ICD-10-CM | POA: Insufficient documentation

## 2017-10-20 DIAGNOSIS — E871 Hypo-osmolality and hyponatremia: Secondary | ICD-10-CM | POA: Insufficient documentation

## 2017-10-20 LAB — COMPREHENSIVE METABOLIC PANEL
ALT: 27 U/L (ref 14–54)
AST: 37 U/L (ref 15–41)
Albumin: 4 g/dL (ref 3.5–5.0)
Alkaline Phosphatase: 51 U/L (ref 38–126)
Anion gap: 9 (ref 5–15)
BUN: 11 mg/dL (ref 6–20)
CHLORIDE: 97 mmol/L — AB (ref 101–111)
CO2: 23 mmol/L (ref 22–32)
CREATININE: 0.74 mg/dL (ref 0.44–1.00)
Calcium: 8.5 mg/dL — ABNORMAL LOW (ref 8.9–10.3)
GFR calc Af Amer: >60 mL/min (ref >60)
GFR calc non Af Amer: >60 mL/min (ref >60)
Glucose, Bld: 112 mg/dL — ABNORMAL HIGH (ref 65–99)
Potassium: 3.1 mmol/L — ABNORMAL LOW (ref 3.5–5.1)
Sodium: 129 mmol/L — ABNORMAL LOW (ref 135–145)
Total Bilirubin: 1.1 mg/dL (ref 0.3–1.2)
Total Protein: 7.1 g/dL (ref 6.5–8.1)

## 2017-10-20 LAB — CBC
HCT: 31.1 % — ABNORMAL LOW (ref 35.0–47.0)
Hemoglobin: 10.6 g/dL — ABNORMAL LOW (ref 12.0–16.0)
MCH: 29.3 pg (ref 26.0–34.0)
MCHC: 34 g/dL (ref 32.0–36.0)
MCV: 86.4 fL (ref 80.0–100.0)
PLATELETS: 160 10*3/uL (ref 150–440)
RBC: 3.61 MIL/uL — AB (ref 3.80–5.20)
RDW: 12.7 % (ref 11.5–14.5)
WBC: 6.7 10*3/uL (ref 3.6–11.0)

## 2017-10-20 LAB — URINALYSIS, COMPLETE (UACMP) WITH MICROSCOPIC
BACTERIA UA: NONE SEEN
Bilirubin Urine: NEGATIVE
Glucose, UA: NEGATIVE mg/dL
Hgb urine dipstick: NEGATIVE
KETONES UR: 5 mg/dL — AB
Leukocytes, UA: NEGATIVE
Nitrite: NEGATIVE
PH: 6 (ref 5.0–8.0)
Protein, ur: 30 mg/dL — AB
Specific Gravity, Urine: 1.03 (ref 1.005–1.030)

## 2017-10-20 LAB — PREGNANCY, URINE: PREG TEST UR: NEGATIVE

## 2017-10-20 LAB — LIPASE, BLOOD: LIPASE: 31 U/L (ref 11–51)

## 2017-10-20 LAB — POCT PREGNANCY, URINE: PREG TEST UR: NEGATIVE

## 2017-10-20 MED ORDER — ONDANSETRON HCL 4 MG/2ML IJ SOLN
4.0000 mg | Freq: Once | INTRAMUSCULAR | Status: AC
  Administered 2017-10-20: 4 mg via INTRAVENOUS

## 2017-10-20 MED ORDER — KETOROLAC TROMETHAMINE 30 MG/ML IJ SOLN
INTRAMUSCULAR | Status: AC
  Administered 2017-10-20: 15 mg via INTRAVENOUS
  Filled 2017-10-20: qty 1

## 2017-10-20 MED ORDER — KETOROLAC TROMETHAMINE 30 MG/ML IJ SOLN
15.0000 mg | Freq: Once | INTRAMUSCULAR | Status: AC
  Administered 2017-10-20: 15 mg via INTRAVENOUS

## 2017-10-20 MED ORDER — SODIUM CHLORIDE 0.9 % IV BOLUS
1000.0000 mL | Freq: Once | INTRAVENOUS | Status: AC
  Administered 2017-10-20: 1000 mL via INTRAVENOUS

## 2017-10-20 MED ORDER — ONDANSETRON HCL 4 MG/2ML IJ SOLN
INTRAMUSCULAR | Status: AC
  Administered 2017-10-20: 4 mg via INTRAVENOUS
  Filled 2017-10-20: qty 2

## 2017-10-20 MED ORDER — ONDANSETRON 4 MG PO TBDP: 4.0000 mg | ORAL_TABLET | Freq: Three times a day (TID) | ORAL | 0 refills | Status: DC | PRN

## 2017-10-20 NOTE — Discharge Instructions (Addendum)
As we discussed please use your nausea medication as needed, as prescribed.  Please drink plenty fluids, use Tylenol or ibuprofen as needed for fever or discomfort.  Return to the emergency department for any worsening abdominal pain or headache.  Please follow-up with your doctor on Friday or Monday for recheck/reevaluation and recheck of your labs as your sodium and potassium levels are low today.

## 2017-10-20 NOTE — ED Provider Notes (Signed)
[facility] Emergency Department Provider Note  Time seen: 2:20 PM  I have reviewed the triage vital signs and the nursing notes.   HISTORY  Chief Complaint Abdominal Pain    HPI Becky Park is a 22 y.o. female with a past medical history of anxiety, reflux, presents to the emergency department for headache, abdominal pain, low-grade fever.  According to the patient yesterday she developed a headache and felt like she was getting a cold.  States she was having some nasal drainage and a mild cough.  States that headache has diminished significantly but today she was experiencing pain in her mid abdomen.  States that is mostly resolved at this time as well but had nausea with 2 episodes of vomiting.  Does state some stiffness in her muscles today.  States she has not been eating or drinking as much.  Denies any dysuria, vaginal discharge, states she is just finished her last menstrual cycle.   Past Medical History:  Diagnosis Date  . Anxiety   . GERD (gastroesophageal reflux disease)     There are no active problems to display for this patient.   History reviewed. No pertinent surgical history.  Prior to Admission medications   Medication Sig Start Date End Date Taking? Authorizing Provider  azithromycin (ZITHROMAX Z-PAK) 250 MG tablet Take 2 tablets (500 mg) on  Day 1,  followed by 1 tablet (250 mg) once daily on Days 2 through 5. 05/14/15   Evon Slack, PA-C  Cetirizine HCl 10 MG CAPS Take 1 capsule (10 mg total) by mouth daily. 09/30/15   Triplett, Rulon Eisenmenger B, FNP  famotidine (PEPCID) 40 MG tablet Take 1 tablet (40 mg total) by mouth every evening. 09/16/14 09/16/15  Myrna Blazer, MD  fluconazole (DIFLUCAN) 150 MG tablet Take 1 tablet (150 mg total) by mouth once. 06/05/15   Menshew, Charlesetta Ivory, PA-C  ibuprofen (ADVIL,MOTRIN) 600 MG tablet Take 1 tablet (600 mg total) by mouth every 6 (six) hours as needed. 09/30/15   Triplett, Rulon Eisenmenger B, FNP   ketoconazole (NIZORAL) 2 % cream Apply 1 application topically 2 (two) times daily. 06/05/15   Menshew, Charlesetta Ivory, PA-C  ranitidine (ZANTAC) 150 MG tablet Take 1 tablet (150 mg total) by mouth at bedtime. 01/23/15 01/23/16  [name], MD    No Known Allergies  History reviewed. No pertinent family history.  Social History Social History   Tobacco Use  . Smoking status: Never Smoker  Substance Use Topics  . Alcohol use: Yes    Alcohol/week: 2.4 oz    Types: 4 Glasses of wine per week  . Drug use: No    Review of Systems Constitutional: Subjective low-grade fever. Eyes: Negative for visual complaints ENT: States mild congestion with nasal discharge.  Denies sore throat. Cardiovascular: Negative for chest pain. Respiratory: Negative for shortness of breath.  Slight cough. Gastrointestinal: States abdominal discomfort earlier today.  Nausea vomiting x2 episodes.  States it has largely resolved now. Genitourinary: Negative for dysuria or hematuria Musculoskeletal: Negative for musculoskeletal complaints Skin: Negative for skin complaints  Neurological: Headache yesterday.  Largely gone today. All other ROS negative  ____________________________________________   PHYSICAL EXAM:  VITAL SIGNS: ED Triage Vitals  Enc Vitals Group     BP 10/20/17 1213 117/74     Pulse Rate 10/20/17 1213 96     Resp 10/20/17 1213 18     Temp 10/20/17 1213 99.7 F (37.6 C)     Temp Source  10/20/17 1213 Oral     SpO2 10/20/17 1213 99 %     Weight 10/20/17 1214 115 lb (52.2 kg)     Height 10/20/17 1214 5\' 7"  (1.702 m)     Head Circumference --      Peak Flow --      Pain Score 10/20/17 1214 10     Pain Loc --      Pain Edu? --      Excl. in GC? --    Constitutional: Alert and oriented. Well appearing and in no distress. Eyes: Normal exam ENT   Head: Normocephalic and atraumatic.   Mouth/Throat: Mucous membranes are moist. Cardiovascular: Normal rate, regular rhythm.  No murmur Respiratory: Normal respiratory effort without tachypnea nor retractions. Breath sounds are clear  Gastrointestinal: Soft and nontender. No distention. Musculoskeletal: Nontender with normal range of motion in all extremities. Neurologic:  Normal speech and language. No gross focal neurologic deficits  Skin:  Skin is warm, dry and intact.  Psychiatric: Mood and affect are normal.   ____________________________________________   INITIAL IMPRESSION / ASSESSMENT AND PLAN / ED COURSE  Pertinent labs & imaging results that were available during my care of the patient were reviewed by me and considered in my medical decision making (see chart for details).  Patient presents to the emergency department with low-grade fever, congestion/rhinorrhea, headache, abdominal pain.  Overall the patient appears very well, no distress, largely normal exam, no abdominal tenderness.  Differential would include viral infection, upper respiratory infection, sinusitis, headache.  Patient's work-up is resulted showing a normal white blood cell count, anemia which appears to be baseline.  Urinalysis is normal.  Chemistry however does show hyponatremia as well as hypokalemia.  Patient states she is only vomited twice today.  Given these labs we will IV hydrate, treat with Zofran and Toradol.  Overall the patient appears well denies any headache at this time.  No nuchal rigidity, nontoxic in appearance.  States she is feeling better after medications and fluids.  I discussed with patient the need to drink plenty of fluids, as well as eat.  I also discussed follow-up with her doctor on Friday or Monday for recheck of her lab work given her low sodium and potassium levels.  Patient agreeable to this plan of care.  I discussed return precautions for any significant fatigue, weakness, any increase in pain.  Patient agreeable to plan of care.  ____________________________________________   FINAL CLINICAL  IMPRESSION(S) / ED DIAGNOSES  Hyponatremia Hypokalemia Headache Abdominal pain    Minna AntisPaduchowski, Alegandra Sommers, MD 10/20/17 [phone]

## 2017-10-20 NOTE — ED Triage Notes (Signed)
Alert, oriented, ambulatory.  States yesterday had a HA at forehead with pressure at her eyes. Today c/o of abd pain. States pain in middle of abd. States vomiting this AM. Denies diarrhea. Still has gallbladder and appendix. Denies hx of HA but sinus problems "run in the family." c/o chills and neck tightness as well. No distress noted.

## 2018-05-01 ENCOUNTER — Emergency Department
Admission: EM | Admit: 2018-05-01 | Discharge: 2018-05-01 | Disposition: A | Discharge status: Home / Self Care | Payer: Self-pay | Attending: Emergency Medicine | Admitting: Emergency Medicine

## 2018-05-01 ENCOUNTER — Emergency Department: Payer: Self-pay

## 2018-05-01 ENCOUNTER — Other Ambulatory Visit: Payer: Self-pay

## 2018-05-01 DIAGNOSIS — Y9351 Activity, roller skating (inline) and skateboarding: Secondary | ICD-10-CM | POA: Insufficient documentation

## 2018-05-01 DIAGNOSIS — Z79899 Other long term (current) drug therapy: Secondary | ICD-10-CM | POA: Insufficient documentation

## 2018-05-01 DIAGNOSIS — X509XXA Other and unspecified overexertion or strenuous movements or postures, initial encounter: Secondary | ICD-10-CM | POA: Insufficient documentation

## 2018-05-01 DIAGNOSIS — Y929 Unspecified place or not applicable: Secondary | ICD-10-CM | POA: Insufficient documentation

## 2018-05-01 DIAGNOSIS — S93402A Sprain of unspecified ligament of left ankle, initial encounter: Secondary | ICD-10-CM | POA: Insufficient documentation

## 2018-05-01 DIAGNOSIS — Y999 Unspecified external cause status: Secondary | ICD-10-CM | POA: Insufficient documentation

## 2018-05-01 MED ORDER — NAPROXEN 500 MG PO TABS: 500.0000 mg | ORAL_TABLET | Freq: Two times a day (BID) | ORAL | 2 refills | Status: AC

## 2018-05-01 NOTE — ED Triage Notes (Signed)
Pt [place] the skate [place] and [place] on left ankle  - pt c/o left ankle swelling and discomfort with ambulation

## 2018-05-01 NOTE — ED Notes (Signed)
Pt states that she was skating today and fell over and she twisted her left ankle. States that she can still walk on it but she can feel that it is a little swollen.

## 2018-05-01 NOTE — ED Provider Notes (Signed)
[facility] Emergency Department Provider Note   ____________________________________________    I have reviewed the triage vital signs and the nursing notes.   HISTORY  Chief Complaint Ankle Pain     HPI Becky Park is a 22 y.o. female who presents with complaints of left ankle pain.  Patient reports she was skateboarding and rolled her left ankle.  She reports she has been able to ambulate on it but it is "sore ".  No other injuries reported.  She does not take anything for this.  Past Medical History:  Diagnosis Date  . Anxiety   . GERD (gastroesophageal reflux disease)     There are no active problems to display for this patient.   History reviewed. No pertinent surgical history.  Prior to Admission medications   Medication Sig Start Date End Date Taking? Authorizing Provider  azithromycin (ZITHROMAX Z-PAK) 250 MG tablet Take 2 tablets (500 mg) on  Day 1,  followed by 1 tablet (250 mg) once daily on Days 2 through 5. 05/14/15   Evon SlackGaines, Thomas C, PA-C  Cetirizine HCl 10 MG CAPS Take 1 capsule (10 mg total) by mouth daily. 09/30/15   Triplett, Rulon Eisenmengerari B, FNP  famotidine (PEPCID) 40 MG tablet Take 1 tablet (40 mg total) by mouth every evening. 09/16/14 09/16/15  Myrna BlazerSchaevitz, David Matthew, MD  fluconazole (DIFLUCAN) 150 MG tablet Take 1 tablet (150 mg total) by mouth once. 06/05/15   Menshew, Charlesetta IvoryJenise V Bacon, PA-C  ibuprofen (ADVIL,MOTRIN) 600 MG tablet Take 1 tablet (600 mg total) by mouth every 6 (six) hours as needed. 09/30/15   Triplett, Rulon Eisenmengerari B, FNP  ketoconazole (NIZORAL) 2 % cream Apply 1 application topically 2 (two) times daily. 06/05/15   Menshew, Charlesetta IvoryJenise V Bacon, PA-C  naproxen (NAPROSYN) 500 MG tablet Take 1 tablet (500 mg total) by mouth 2 (two) times daily with a meal. 05/01/18   Jene EveryKinner, Cheney Gosch, MD  ondansetron (ZOFRAN ODT) 4 MG disintegrating tablet Take 1 tablet (4 mg total) by mouth every 8 (eight) hours as needed for nausea or vomiting.  10/20/17   Minna AntisPaduchowski, Kevin, MD  ranitidine (ZANTAC) 150 MG tablet Take 1 tablet (150 mg total) by mouth at bedtime. 01/23/15 01/23/16  Emily FilbertWilliams, Jonathan E, MD     Allergies Patient has no known allergies.  No family history on file.  Social History Social History   Tobacco Use  . Smoking status: Never Smoker  . Smokeless tobacco: Never Used  Substance Use Topics  . Alcohol use: Not Currently    Alcohol/week: 4.0 standard drinks    Types: 4 Glasses of wine per week  . Drug use: No    Review of Systems         Musculoskeletal: Ankle pain as above Skin: No abrasions Neurological: Negative for no numbness or tingling    ____________________________________________   PHYSICAL EXAM:  VITAL SIGNS: ED Triage Vitals  Enc Vitals Group     BP 05/01/18 1809 133/81     Pulse Rate 05/01/18 1809 81     Resp 05/01/18 1809 15     Temp 05/01/18 1809 98.6 F (37 C)     Temp Source 05/01/18 1809 Oral     SpO2 05/01/18 1809 99 %     Weight 05/01/18 1810 56.7 kg (125 lb)     Height 05/01/18 1810 1.702 m (5\' 7" )     Head Circumference --      Peak Flow --      Pain  Score 05/01/18 1809 1     Pain Loc --      Pain Edu? --      Excl. in GC? --      Constitutional: Alert and oriented. No acute distress. Pleasant and interactive  Nose: No congestion/rhinnorhea. Mouth/Throat: Mucous membranes are moist.   Cardiovascular: Normal rate, regular rhythm.  Respiratory: Normal respiratory effort.  No retractions. Genitourinary: deferred Musculoskeletal: Left ankle: Mild swelling over the lateral malleolus, no pain with axial load.  Normal range of motion with mild discomfort.  Patient is able to ambulate Neurologic:  Normal speech and language. No gross focal neurologic deficits are appreciated.   Skin:  Skin is warm, dry and intact. No rash noted.   ____________________________________________   LABS (all labs ordered are listed, but only abnormal results are  displayed)  Labs Reviewed  POC URINE PREG, ED   ____________________________________________  EKG   ____________________________________________  RADIOLOGY  X-ray negative for fracture ____________________________________________   PROCEDURES  Procedure(s) performed: No  Procedures   Critical Care performed: No ____________________________________________   INITIAL IMPRESSION / ASSESSMENT AND PLAN / ED COURSE  Pertinent labs & imaging results that were available during my care of the patient were reviewed by me and considered in my medical decision making (see chart for details).  Patient ambulating well, with mild ankle sprain, Ace wrap applied, recommend supportive care, rice   ____________________________________________   FINAL CLINICAL IMPRESSION(S) / ED DIAGNOSES  Final diagnoses:  Sprain of left ankle, unspecified ligament, initial encounter      NEW MEDICATIONS STARTED DURING THIS VISIT:  Discharge Medication List as of 05/01/2018  7:50 PM    START taking these medications   Details  naproxen (NAPROSYN) 500 MG tablet Take 1 tablet (500 mg total) by mouth 2 (two) times daily with a meal., Starting Sun 05/01/2018, Print         Note:  This document was prepared using Dragon voice recognition software and may include unintentional dictation errors.    Jene Every, MD 05/01/18 2129

## 2018-05-01 NOTE — ED Notes (Signed)
POC URINE PREG NEGATIVE _ ISSUE WITH SYSTEM TRANSFERRING RESULTS

## 2018-05-02 LAB — POCT PREGNANCY, URINE: PREG TEST UR: NEGATIVE

## 2018-06-24 DIAGNOSIS — F410 Panic disorder [episodic paroxysmal anxiety] without agoraphobia: Secondary | ICD-10-CM

## 2018-06-24 DIAGNOSIS — F79 Unspecified intellectual disabilities: Secondary | ICD-10-CM

## 2018-06-24 DIAGNOSIS — F331 Major depressive disorder, recurrent, moderate: Secondary | ICD-10-CM

## 2018-06-24 DIAGNOSIS — F422 Mixed obsessional thoughts and acts: Secondary | ICD-10-CM

## 2018-06-24 DIAGNOSIS — Z62810 Personal history of physical and sexual abuse in childhood: Secondary | ICD-10-CM

## 2018-06-24 DIAGNOSIS — F411 Generalized anxiety disorder: Secondary | ICD-10-CM

## 2018-11-14 ENCOUNTER — Ambulatory Visit: Payer: Self-pay | Admitting: Licensed Clinical Social Worker

## 2018-11-14 ENCOUNTER — Other Ambulatory Visit: Payer: Self-pay

## 2018-11-14 DIAGNOSIS — F410 Panic disorder [episodic paroxysmal anxiety] without agoraphobia: Secondary | ICD-10-CM

## 2018-11-14 DIAGNOSIS — F422 Mixed obsessional thoughts and acts: Secondary | ICD-10-CM

## 2018-11-14 DIAGNOSIS — F331 Major depressive disorder, recurrent, moderate: Secondary | ICD-10-CM

## 2018-11-14 NOTE — Progress Notes (Signed)
Counselor/Therapist Progress Note  Patient ID: Becky Park, MRN: [id number],    Date: 11/14/2018  Time Spent: 45 minutes    Treatment Type: Psychotherapy  Reported Symptoms: Obsessive thinking and anxiety, anxious thoughts,   Mental Status Exam:  Appearance:   NA     Behavior:  Appropriate and Sharing  Motor:  Normal  Speech/Language:   Normal Rate  Affect:  Full Range  Mood:  anxious and irritable  Thought process:  normal  Thought content:    WNL  Sensory/Perceptual disturbances:    WNL  Orientation:  oriented to person, place and time/date  Attention:  Good  Concentration:  Good  Memory:  WNL  Fund of knowledge:   Good  Insight:    Good  Judgment:   Fair  Impulse Control:  Fair   Risk Assessment: Danger to Self:  No Self-injurious Behavior: No Danger to Others: No Duty to Warn:no Physical Aggression / Violence:No  Access to Firearms a concern: No  Gang Involvement:No   Subjective: Patient was engaged throughout the session using time for emotional release and processing of multiple stressors. Patient voices continued anxiety symptoms with overall mood stability. She continues to report compliance with medication management and benefit from regular sessions.   Interventions: Cognitive Behavioral Therapy. Engaged patient in processing distress related to multiple stressors. Explored patient's perception of stressors, highlighting unhelpful thoughts leading to distress. Reviewed coping skills, including use of mindfulness and engagement in meaningful activities. Provided support through active listening, validation of feelings, and highlighted patient's strengths.     Diagnosis:   ICD-10-CM   1. 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 Major depressive disorder, recurrent episode, moderate (HCC)  F33.1   3. Mixed obsessional thoughts and acts  F42.2     Plan: Continue sessions at a cadence of every 3 weeks. Continue CBTs.   Milton Ferguson, LCSW

## 2018-12-06 ENCOUNTER — Ambulatory Visit: Payer: Medicaid Other | Admitting: Licensed Clinical Social Worker

## 2018-12-06 DIAGNOSIS — F331 Major depressive disorder, recurrent, moderate: Secondary | ICD-10-CM

## 2018-12-06 DIAGNOSIS — F410 Panic disorder [episodic paroxysmal anxiety] without agoraphobia: Secondary | ICD-10-CM

## 2018-12-06 DIAGNOSIS — F422 Mixed obsessional thoughts and acts: Secondary | ICD-10-CM

## 2018-12-06 NOTE — Progress Notes (Signed)
Counselor/Therapist Progress Note  Patient ID: Becky Park, MRN: [id number],    Date: 12/06/2018  Time Spent: 17 minutes   Treatment Type: Psychotherapy   Reported Symptoms: anxiety, anxious thoughts, impulsivity   Mental Status Exam:  Appearance:   NA     Behavior:  Appropriate and Sharing  Motor:  NA  Speech/Language:   Normal Rate  Affect:  NA  Mood:  normal  Thought process:  normal  Thought content:    WNL  Sensory/Perceptual disturbances:    WNL  Orientation:  oriented to person, place and time/date  Attention:  Good  Concentration:  Good  Memory:  WNL  Fund of knowledge:   Good  Insight:    Good  Judgment:   Good  Impulse Control:  Good   Risk Assessment: Danger to Self:  No Self-injurious Behavior: No Danger to Others: No Duty to Warn:no Physical Aggression / Violence:No  Access to Firearms a concern: No  Gang Involvement:No   Subjective: Patient was engaged and cooperative throughout the session using time effectively to discuss thoughts and feelings. Patient continued challenges with social interactions due to developmental challenges and mental health symptoms. She voices continued motivation for treatment and understanding of of mental health symptoms. Patient is likely to benefit from future treatment because she remains motivated to decrease anxiety symptoms and reports benefit of regular sessions in combination with medication management in addressing these symptoms.    Interventions: Cognitive Behavioral Therapy and Mindfulness Meditation  Established psychological safety. Engaged patient in processing current psychosocial stressors. Charted out patient's thoughts, emotions, and behaviors, identifying points of intervention. Reframed unhelpful thoughts. Reviewed mindfulness, engaged patient in mindfulness exercise, processed exercise. Contracted with patient to use mindfulness when she is feeling strong emotions. Provided support through active listening,  validation of feelings, and highlighted patient's strengths.     Diagnosis:   ICD-10-CM   1. 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 Major depressive disorder, recurrent episode, moderate (HCC)  F33.1   3. Mixed obsessional thoughts and acts  F42.2     Plan: Continue CBTs - tools to manage anxiety symptoms, including mindfulness, engagement in activities. Continue medication management.    Interpreter used: NA   Milton Ferguson, LCSW

## 2018-12-20 ENCOUNTER — Encounter: Payer: Self-pay | Admitting: Emergency Medicine

## 2018-12-20 ENCOUNTER — Other Ambulatory Visit: Payer: Self-pay

## 2018-12-20 ENCOUNTER — Emergency Department
Admission: EM | Admit: 2018-12-20 | Discharge: 2018-12-20 | Disposition: A | Discharge status: Home / Self Care | Payer: Medicaid Other | Attending: Emergency Medicine | Admitting: Emergency Medicine

## 2018-12-20 DIAGNOSIS — Y929 Unspecified place or not applicable: Secondary | ICD-10-CM | POA: Insufficient documentation

## 2018-12-20 DIAGNOSIS — Y939 Activity, unspecified: Secondary | ICD-10-CM | POA: Insufficient documentation

## 2018-12-20 DIAGNOSIS — S025XXA Fracture of tooth (traumatic), initial encounter for closed fracture: Secondary | ICD-10-CM

## 2018-12-20 DIAGNOSIS — X58XXXA Exposure to other specified factors, initial encounter: Secondary | ICD-10-CM | POA: Insufficient documentation

## 2018-12-20 DIAGNOSIS — Y999 Unspecified external cause status: Secondary | ICD-10-CM | POA: Insufficient documentation

## 2018-12-20 DIAGNOSIS — K0889 Other specified disorders of teeth and supporting structures: Secondary | ICD-10-CM

## 2018-12-20 MED ORDER — LIDOCAINE VISCOUS HCL 2 % MT SOLN: 10.0000 mL | OROMUCOSAL | 0 refills | Status: AC | PRN

## 2018-12-20 MED ORDER — TRAMADOL HCL 50 MG PO TABS: 50.0000 mg | ORAL_TABLET | Freq: Four times a day (QID) | ORAL | 0 refills | Status: AC | PRN

## 2018-12-20 MED ORDER — MELOXICAM 15 MG PO TABS: 15.0000 mg | ORAL_TABLET | Freq: Every day | ORAL | 0 refills | Status: AC
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
## 2018-12-20 NOTE — ED Notes (Signed)
See triage note  Presents with possible dental abscess   States she developed pain about 1 week ago after biting into something hard  Min swelling noted to face

## 2018-12-20 NOTE — Discharge Instructions (Signed)
OPTIONS FOR DENTAL FOLLOW UP CARE ° °[place] [facility] and [facility] - Local Safety Net Dental Clinics °http://www.ncdhhs.gov/dph/oralhealth/services/safetynetclinics.htm °  °[facility] (336-562-3123) ° °[facility] [place] (919-933-9087) ° °[facility] ([phone] ext [phone]) ° °[facility] ([phone]) ° °[facility] ([phone]) °This clinic caters to the indigent population and is on a lottery system. °Location: °[facility], [address], [place] °Clinic Hours: °Wednesdays from 6pm - 9pm, patients seen by a lottery system. °For dates, call or go to [email or web address].[facility].edu/[facility]/patients/Dental-[facility] °Services: °Cleanings, fillings and simple extractions. °Payment Options: °DENTAL WORK IS FREE OF CHARGE. Bring proof of income or support. °Best way to get seen: °Arrive at 5:15 pm - this is a lottery, NOT first come/first serve, so arriving earlier will not increase your chances of being seen. °  °  °[facility] °[phone] °Select option 1 for emergencies °  °Location: °[facility], [address], [place] °Clinic Hours: °No walk-ins accepted - call the day before to schedule an appointment. °Check in times are 9:30 am and 1:30 pm. °Services: °Simple extractions, temporary fillings, pulpectomy/pulp debridement, uncomplicated abscess drainage. °Payment Options: °PAYMENT IS DUE AT THE TIME OF SERVICE.  Fee is usually $100-200, additional surgical procedures (e.g. abscess drainage) may be extra. °Cash, checks, Visa/MasterCard accepted.  Can file Medicaid if patient is covered for dental - patient should call case worker to check. °No discount for [facility] patients. °Best way to get seen: °MUST call the day before and get onto the schedule. Can usually be seen the next 1-2 days. No walk-ins accepted. °  °  °[facility] °[phone] °   °Location: °[facility], [address], [place] °Clinic Hours: °M, W, Th, F 8am or 1:30pm, Tues 9a or 1:30 - first come/first served. °Services: °Simple extractions, temporary fillings, uncomplicated abscess drainage.  You do not need to be an [place] resident. °Payment Options: °PAYMENT IS DUE AT THE TIME OF SERVICE. °Dental insurance, otherwise sliding scale - bring proof of income or support. °Depending on income and treatment needed, cost is usually $50-200. °Best way to get seen: °Arrive early as it is first come/first served. °  °  °[facility] °[phone] °  °Location: °[address] °Clinic Hours: °Mon-Thu 8a-5p °Services: °Most basic dental services including extractions and fillings. °Payment Options: °PAYMENT IS DUE AT THE TIME OF SERVICE. °Sliding scale, up to 50% off - bring proof if income or support. °Medicaid with dental option accepted. °Best way to get seen: °Call to schedule an appointment, can usually be seen within 2 weeks OR they will try to see walk-ins - show up at 8a or 2p (you may have to wait). °  °  °[facility] °919-245-2435 °[place] RESIDENTS ONLY °  °Location: °[facility], [address], [place], [place] [address] °Clinic Hours: By appointment only. °Monday - Thursday 8am-5pm, Friday 8am-12pm °Services: Cleanings, fillings, extractions. °Payment Options: °PAYMENT IS DUE AT THE TIME OF SERVICE. °Cash, Visa or MasterCard. Sliding scale - $30 minimum per service. °Best way to get seen: °Come in to office, complete packet and make an appointment - need proof of income °or support monies for each household member and proof of [place] residence. °Usually takes about a month to get in. °  °  °[facility] °[phone] °  °Location: °[address]   [place] °Clinic Hours: Walk-in Urgent Care Dental Services are offered Monday-Friday  mornings only. °The numbers of emergencies accepted daily is limited to the number of °providers available. °Maximum 15 - Mondays, Wednesdays & Thursdays °Maximum 10 - Tuesdays & Fridays °Services: °You do not need to be a [place] resident to be seen for a dental emergency. °Emergencies are defined as pain, swelling, abnormal bleeding, or dental trauma. Walkins will receive x-rays if needed. °NOTE: Dental cleaning is not an emergency. °Payment Options: °PAYMENT IS DUE AT THE TIME OF SERVICE. °Minimum co-pay is $40.00 for uninsured patients. °Minimum co-pay is $3.00 for Medicaid with dental coverage. °Dental Insurance is accepted and must be presented at time of visit. °Medicare does not cover dental. °Forms of payment: Cash, credit card, checks. °Best way to get seen: °If not previously registered with the clinic, walk-in dental registration begins at 7:15 am and is on a first come/first serve basis. °If previously registered with the clinic, call to make an appointment. °  °  °[facility] °[phone] °[place] RESIDENTS ONLY °  °Location: °[address], [place], [place] °Clinic Hours: °Mon-Thu 10a-2p °Services: Extractions only! °Payment Options: °FREE (donations accepted) - bring proof of income or support °Best way to get seen: °Call and schedule an appointment OR come at 8am on the 1st Monday of every month (except for holidays) when it is first come/first served. °  °  °[facility] °[phone] °  °Location: °[address], [place] °Clinic Hours: °Friday mornings °Services, Payment Options, Best way to get seen: °Call for info °

## 2018-12-20 NOTE — ED Triage Notes (Signed)
C/O broken tooth, toothache to right side of jaw.  States initially symptoms included facial swelling, but that has resolved.  Onset of symptoms 2 weeks ago.

## 2018-12-27 ENCOUNTER — Ambulatory Visit: Payer: Medicaid Other | Admitting: Licensed Clinical Social Worker

## 2018-12-27 DIAGNOSIS — F422 Mixed obsessional thoughts and acts: Secondary | ICD-10-CM

## 2018-12-27 DIAGNOSIS — F331 Major depressive disorder, recurrent, moderate: Secondary | ICD-10-CM

## 2018-12-27 DIAGNOSIS — F410 Panic disorder [episodic paroxysmal anxiety] without agoraphobia: Secondary | ICD-10-CM

## 2018-12-27 DIAGNOSIS — F411 Generalized anxiety disorder: Secondary | ICD-10-CM

## 2018-12-27 NOTE — Progress Notes (Signed)
Counselor/Therapist Progress Note  Patient ID: Becky Park, MRN: [id number],    Date: 12/27/2018  Time Spent: 61 minutes   Treatment Type: Individual Therapy  Reported Symptoms: Feelings of Worthlessness, Hopelessness, Obsessive thinking and anxiety, stress   Mental Status Exam:  Appearance:   NA     Behavior:  Appropriate and Sharing  Motor:  Normal  Speech/Language:   Normal Rate  Affect:  NA  Mood:  normal  Thought process:  normal  Thought content:    WNL  Sensory/Perceptual disturbances:    WNL  Orientation:  oriented to person, place and time/date  Attention:  Good  Concentration:  Good  Memory:  WNL  Fund of knowledge:   Good  Insight:    Good  Judgment:   Good  Impulse Control:  Good   Risk Assessment: Danger to Self:  No Self-injurious Behavior: No Danger to Others: No Duty to Warn:no Physical Aggression / Violence:No  Access to Firearms a concern: No  Gang Involvement:No   Subjective: Patient was engaged and cooperative throughout the session using time effectively to discuss thoughts and feelings. Patient voices increased frustration, overwhelm and stress due to Covid-19 and current social issues. Patient remains motivated to decrease symptoms and improve functioning and reports benefit of regular sessions in combination of addressing these symptoms.    Interventions: Cognitive Behavioral Therapy and Motivational Interviewing  Established psychological safety. Engaged patient in processing multiple stressors, encouraging her to process and release emotional distress. Used MI to assist patient in developing a plan of self-care (brushing teeth). And provided resources on dental care options. Discussed increasing sessions due to current level of symptoms. Reviewed strategies to regulate emotions- including skateboarding, bike riding, listening to music. Provided support through active listening, validation of feelings, and highlighted patient's strengths.     Diagnosis:   ICD-10-CM   1. 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 Major depressive disorder, recurrent episode, moderate ([facility])  F33.1   3. Mixed obsessional thoughts and acts  F42.2     Plan: Continue CBTs and use of MI to assist patient in developing helpful behaviors. Increase cadence of sessions and next session we plan to use Zoom. Patient to continue medication management.   Interpreter used: NA   Milton Ferguson, LCSW

## 2019-01-03 ENCOUNTER — Ambulatory Visit: Payer: Medicaid Other | Admitting: Licensed Clinical Social Worker

## 2019-01-03 DIAGNOSIS — F411 Generalized anxiety disorder: Secondary | ICD-10-CM

## 2019-01-03 DIAGNOSIS — F331 Major depressive disorder, recurrent, moderate: Secondary | ICD-10-CM

## 2019-01-03 DIAGNOSIS — F410 Panic disorder [episodic paroxysmal anxiety] without agoraphobia: Secondary | ICD-10-CM

## 2019-01-03 NOTE — Progress Notes (Signed)
Counselor/Therapist Progress Note  Patient ID: Becky Park, MRN: [id number],    Date: 01/03/2019  Time Spent: 45 minutes    Treatment Type: Psychotherapy  Reported Symptoms: Obsessive thinking and anxiety, panic sensations  Mental Status Exam:  Appearance:   Casual     Behavior:  Appropriate and Sharing  Motor:  Normal  Speech/Language:   Normal Rate  Affect:  Appropriate  Mood:  anxious  Thought process:  normal and anxious   Thought content:    WNL  Sensory/Perceptual disturbances:    WNL  Orientation:  oriented to person, place and time/date  Attention:  Good  Concentration:  Good  Memory:  WNL  Fund of knowledge:   Good  Insight:    Good  Judgment:   Good  Impulse Control:  Fair   Risk Assessment: Danger to Self:  No Self-injurious Behavior: No Danger to Others: No Duty to Warn:no Physical Aggression / Violence:No  Access to Firearms a concern: No  Gang Involvement:No   Subjective: Patient was engaged and cooperative throughout the session using time effectively to discuss thoughts and feelings. Patient voices continued motivation for treatment and reports that she likes using Zoom. Patient reports noticing self-talk and trying to change her negative self-talk. Patient is likely to benefit from future treatment because she remains motivated to decrease anxiety/depression symptoms and reports benefit of regular sessions in addressing these symptoms.   Interventions: Cognitive Behavioral Therapy  Established psychological safety. Used Zoom. Provided supportive space encouraging emotional release and processing of current psychosocial stressors. Explored patient's perception of self, encouraging her to identify origins of these negative perceptions and using cognitive restructuring to assist patient in having more balance thinking. Also discussed the use of positive balance self-talk. Checked in with patient regarding previous session and discussed patient's progress  with teeth brushing. Provided support through active listening, validation of feelings, and highlighted patient's strengths.    Diagnosis:   ICD-10-CM   1. 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 Major depressive disorder, recurrent episode, moderate ([facility])  F33.1     Plan: Use CBT strategies to assist patient in changing unhelpful thought patterns; increase positive behaviors that lead to symptoms management. Continue Zoom sessions at this time.   Interpreter used: NA   Milton Ferguson, LCSW

## 2019-01-17 ENCOUNTER — Ambulatory Visit: Payer: Medicaid Other | Admitting: Licensed Clinical Social Worker

## 2019-01-17 DIAGNOSIS — F410 Panic disorder [episodic paroxysmal anxiety] without agoraphobia: Secondary | ICD-10-CM

## 2019-01-17 DIAGNOSIS — F331 Major depressive disorder, recurrent, moderate: Secondary | ICD-10-CM

## 2019-01-17 DIAGNOSIS — F422 Mixed obsessional thoughts and acts: Secondary | ICD-10-CM

## 2019-01-17 NOTE — Progress Notes (Signed)
Counselor/Therapist Progress Note  Patient ID: Becky Park, MRN: [id number],    Date: 01/17/2019  Time Spent: 54 minutes   Treatment Type: Psychotherapy  Reported Symptoms: overall mood stability; continued anxiety, and anxious thought patterns   Mental Status Exam:  Appearance:   Casual     Behavior:  Appropriate and Sharing  Motor:  Normal  Speech/Language:   Normal Rate  Affect:  Appropriate  Mood:  normal  Thought process:  normal  Thought content:    WNL  Sensory/Perceptual disturbances:    WNL  Orientation:  oriented to person, place and time/date  Attention:  Good  Concentration:  Good  Memory:  WNL  Fund of knowledge:   Good  Insight:    Good  Judgment:   Good  Impulse Control:  Good   Risk Assessment: Danger to Self:  No Self-injurious Behavior: No Danger to Others: No Duty to Warn:no Physical Aggression / Violence:No  Access to Firearms a concern: No  Gang Involvement:No   Subjective: Patient was engaged and cooperative throughout the session using time effectively to discuss thoughts and feelings. Patient voices improvement in relationships- she reports using a lot of self-talk telling herself to not think negative. Patient voices continued motivation for treatment and understanding of anxiety issues. Patient is likely to benefit from future treatment because she remains motivated to decrease symptoms and improve functioning and reports benefit from the combination of med management and of regular sessions in addressing these symptoms.   Interventions: Cognitive Behavioral Therapy  Established psychological safety. Checked in with patient. Provided supportive space encouraging emotional release and processing of current psychosocial stressors. Conducted behavior chain analysis of social interactions, rehearsing social skills and discussing the importance of establishing boundaries. Provided support through active listening, validation of feelings, and  highlighted patient's strengths.    Diagnosis:   ICD-10-CM   1. 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 Major depressive disorder, recurrent episode, moderate (HCC)  F33.1   3. Mixed obsessional thoughts and acts  F42.2     Plan: Continue using CBTs to address patient's unhelpful thinking. Continue Zoom sessions. Due to increased stability time between sessions will be lengthened to 3 weeks.   Interpreter used: NA   Milton Ferguson, LCSW

## 2019-02-07 ENCOUNTER — Ambulatory Visit: Payer: Medicaid Other | Admitting: Licensed Clinical Social Worker

## 2019-02-07 DIAGNOSIS — F410 Panic disorder [episodic paroxysmal anxiety] without agoraphobia: Secondary | ICD-10-CM

## 2019-02-07 DIAGNOSIS — F331 Major depressive disorder, recurrent, moderate: Secondary | ICD-10-CM

## 2019-02-07 DIAGNOSIS — F422 Mixed obsessional thoughts and acts: Secondary | ICD-10-CM

## 2019-02-07 DIAGNOSIS — F411 Generalized anxiety disorder: Secondary | ICD-10-CM

## 2019-02-07 NOTE — Progress Notes (Signed)
Counselor/Therapist Progress Note  Patient ID: Becky Park, MRN: [id number],    Date: 02/07/2019  Time Spent: 23 minutes    Treatment Type: Psychotherapy  Reported Symptoms: anxiety, anxious thought patterns   Mental Status Exam:   Appearance:   Casual     Behavior:  Appropriate and Sharing  Motor:  Normal  Speech/Language:   Normal Rate  Affect:  Appropriate  Mood:  normal  Thought process:  normal  Thought content:    WNL  Sensory/Perceptual disturbances:    WNL  Orientation:  oriented to person, place and time/date  Attention:  Good  Concentration:  Good  Memory:  WNL  Fund of knowledge:   Good  Insight:    Good  Judgment:   Good  Impulse Control:  Good   Risk Assessment: Danger to Self:  No Self-injurious Behavior: No Danger to Others: No Duty to Warn:no Physical Aggression / Violence:No  Access to Firearms a concern: No  Gang Involvement:No   Subjective: Patient was engaged and cooperative throughout the session using time effectively to discuss thoughts and feelings.   Patient voices continued improvement in symptoms, including decrease in anxiety, decrease in behavioral response when frustrated and some improvement in view of herself. She voices continued motivation for treatment and understanding of anxiety. Patient is likely to benefit from future treatment because she remains motivated to decrease symptoms and reports benefit from ac combination of medication management and talk therapy.     Interventions: Cognitive Behavioral Therapy  Established psychological safety. Engaged patient in processing current psychosocial stressors- work stressors and challenges in social situations, including romantic relationship. Provided support through active listening, validation of feelings, and highlighted patient's strengths.    Diagnosis:   ICD-10-CM   1. 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 Major depressive disorder, recurrent  episode, moderate ([facility])  F33.1   3. Mixed obsessional thoughts and acts  F42.2     Plan: Patient to continue medication management through PCP.  Continue to use CBTs assisting patient in changing unhelpful thought patterns; increase positive behaviors that lead to symptoms management. Continue Zoom sessions.   Interpreter used: NA   Milton Ferguson, LCSW

## 2019-02-28 ENCOUNTER — Ambulatory Visit: Payer: Medicaid Other | Admitting: Licensed Clinical Social Worker

## 2019-02-28 DIAGNOSIS — F422 Mixed obsessional thoughts and acts: Secondary | ICD-10-CM

## 2019-02-28 DIAGNOSIS — F411 Generalized anxiety disorder: Secondary | ICD-10-CM

## 2019-02-28 DIAGNOSIS — F331 Major depressive disorder, recurrent, moderate: Secondary | ICD-10-CM

## 2019-02-28 DIAGNOSIS — F410 Panic disorder [episodic paroxysmal anxiety] without agoraphobia: Secondary | ICD-10-CM

## 2019-02-28 NOTE — Progress Notes (Signed)
Counselor/Therapist Progress Note  Patient ID: Becky Park, MRN: [id number],    Date: 02/28/2019  Time Spent: 45 minutes   Treatment Type: Psychotherapy  Reported Symptoms: Anxious thoughts, feeling overwhelmed, frustration, low mood at times   Mental Status Exam:  Appearance:   Casual     Behavior:  Appropriate and Sharing  Motor:  Normal  Speech/Language:   Normal Rate  Affect:  Appropriate  Mood:  anxious  Thought process:  normal  Thought content:    WNL  Sensory/Perceptual disturbances:    WNL  Orientation:  oriented to person, place and time/date  Attention:  Good  Concentration:  Good  Memory:  WNL  Fund of knowledge:   Good  Insight:    Good  Judgment:   Fair  Impulse Control:  Fair   Risk Assessment: Danger to Self:  No Self-injurious Behavior: No Danger to Others: No Duty to Warn:no Physical Aggression / Violence:No  Access to Firearms a concern: No  Gang Involvement:No   Subjective: Patient was engaged and cooperative throughout the session using time effectively to discuss thoughts and feelings. Patient voices continued motivation for treatment and understanding of mood and anxiety issues. Patient is likely to benefit from future treatment because she remains motivated to decrease symptoms and improve functioning and reports benefit of regular sessions in addressing these symptoms.    Interventions: Cognitive Behavioral Therapy  Established psychological safety. Engaged patient in processing current psychosocial stressors - increased anxiety due to new role at work. Validated patient's feelings of overwhelm and frustration, identifying origins of these feelings. Provided support through active listening, validation of feelings, and highlighted patient's strengths.   Diagnosis:   ICD-10-CM   1. 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 Major depressive disorder, recurrent episode, moderate (HCC)  F33.1   3. Mixed obsessional  thoughts and acts  F42.2     Plan: Patient to continue medication management through PCP.  Continue to use CBTs assisting patient in changing unhelpful thought patterns; increase positive behaviors that lead to symptoms management. Continue Zoom sessions.   Future Appointments  Date Time Provider [place]  03/28/2019  4:00 PM Milton Ferguson, LCSW [facility] None    Interpreter used: NA  Milton Ferguson, [place]

## 2019-03-28 ENCOUNTER — Ambulatory Visit: Payer: Medicaid Other | Admitting: Licensed Clinical Social Worker

## 2019-03-28 DIAGNOSIS — F331 Major depressive disorder, recurrent, moderate: Secondary | ICD-10-CM

## 2019-03-28 DIAGNOSIS — F422 Mixed obsessional thoughts and acts: Secondary | ICD-10-CM

## 2019-03-28 DIAGNOSIS — F410 Panic disorder [episodic paroxysmal anxiety] without agoraphobia: Secondary | ICD-10-CM

## 2019-03-28 DIAGNOSIS — F411 Generalized anxiety disorder: Secondary | ICD-10-CM

## 2019-03-28 NOTE — Progress Notes (Signed)
Counselor/Therapist Progress Note  Patient ID: Becky Park, MRN: [id number],    Date: 03/28/2019  Time Spent: 55 minutes   Treatment Type: Psychotherapy  Reported Symptoms: static symptoms  Mental Status Exam:  Appearance:   Casual     Behavior:  Appropriate and Sharing  Motor:  Normal  Speech/Language:   Normal Rate  Affect:  Appropriate  Mood:  normal  Thought process:  normal  Thought content:    WNL  Sensory/Perceptual disturbances:    WNL  Orientation:  oriented to person, place, time/date and situation  Attention:  Good  Concentration:  Good  Memory:  WNL  Fund of knowledge:   Good  Insight:    Good  Judgment:   Good  Impulse Control:  Good   Risk Assessment: Danger to Self:  No Self-injurious Behavior: No Danger to Others: No Duty to Warn:no Physical Aggression / Violence:No  Access to Firearms a concern: No  Gang Involvement:No   Subjective: Patient was engaged and cooperative throughout the session using time effectively to discuss thoughts and feelings. Patient voices static anxiety symptoms and understanding of CBTs coping stratigies to continue to manage symptoms. She is in agreement with plan to terminate services reporting benefit and appreciation for services and is aware that she can return in the future, as needed. Patient is recommended to continue medication management and reports benefit of medication management.  Interventions: Cognitive Behavioral Therapy  Established psychological safety. Provided supportive space encouraging emotional release and processing of current psychosocial stressors overall stable symptoms; challenges in relationship. Explored patient's perception of trust issues in relationship, charting out patient's thoughts and exploring alternative thoughts. Discussed termination of services, including review of progress made, and coping strategies to prevent relapse. Provided support through active listening, validation of feelings,  and highlighted patient's strengths.   Diagnosis:   ICD-10-CM   1. 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 Major depressive disorder, recurrent episode, moderate (HCC)  F33.1   3. Mixed obsessional thoughts and acts  F42.2     Plan: Patient to continue medication management through PCP.  Patient to return for services as needed in the future. Patient in agreement to terminate treatment at this time.   Interpreter used: NA   Milton Ferguson, LCSW
